# Patient Record
Sex: Male | Born: 1945 | Race: Black or African American | Hispanic: No | Smoking: Former smoker
Health system: Southern US, Community
[De-identification: ages and names within clinical notes are randomized; demographics above are authoritative.]

## PROBLEM LIST (undated history)

## (undated) DIAGNOSIS — E119 Type 2 diabetes mellitus without complications: Secondary | ICD-10-CM

## (undated) DIAGNOSIS — F329 Major depressive disorder, single episode, unspecified: Secondary | ICD-10-CM

## (undated) DIAGNOSIS — K219 Gastro-esophageal reflux disease without esophagitis: Secondary | ICD-10-CM

## (undated) DIAGNOSIS — K449 Diaphragmatic hernia without obstruction or gangrene: Secondary | ICD-10-CM

## (undated) DIAGNOSIS — F4310 Post-traumatic stress disorder, unspecified: Secondary | ICD-10-CM

## (undated) DIAGNOSIS — N401 Enlarged prostate with lower urinary tract symptoms: Secondary | ICD-10-CM

## (undated) DIAGNOSIS — M519 Unspecified thoracic, thoracolumbar and lumbosacral intervertebral disc disorder: Secondary | ICD-10-CM

## (undated) DIAGNOSIS — N529 Male erectile dysfunction, unspecified: Secondary | ICD-10-CM

## (undated) DIAGNOSIS — M5136 Other intervertebral disc degeneration, lumbar region: Secondary | ICD-10-CM

## (undated) DIAGNOSIS — F32A Depression, unspecified: Secondary | ICD-10-CM

## (undated) DIAGNOSIS — I739 Peripheral vascular disease, unspecified: Secondary | ICD-10-CM

## (undated) DIAGNOSIS — I10 Essential (primary) hypertension: Secondary | ICD-10-CM

## (undated) DIAGNOSIS — Z973 Presence of spectacles and contact lenses: Secondary | ICD-10-CM

## (undated) DIAGNOSIS — M48062 Spinal stenosis, lumbar region with neurogenic claudication: Secondary | ICD-10-CM

## (undated) DIAGNOSIS — C61 Malignant neoplasm of prostate: Secondary | ICD-10-CM

## (undated) DIAGNOSIS — N400 Enlarged prostate without lower urinary tract symptoms: Secondary | ICD-10-CM

## (undated) DIAGNOSIS — Z974 Presence of external hearing-aid: Secondary | ICD-10-CM

## (undated) DIAGNOSIS — G8929 Other chronic pain: Secondary | ICD-10-CM

## (undated) DIAGNOSIS — Z860101 Personal history of adenomatous and serrated colon polyps: Secondary | ICD-10-CM

## (undated) DIAGNOSIS — M1990 Unspecified osteoarthritis, unspecified site: Secondary | ICD-10-CM

## (undated) DIAGNOSIS — G5601 Carpal tunnel syndrome, right upper limb: Secondary | ICD-10-CM

## (undated) DIAGNOSIS — Z86010 Personal history of colonic polyps: Secondary | ICD-10-CM

## (undated) DIAGNOSIS — M51369 Other intervertebral disc degeneration, lumbar region without mention of lumbar back pain or lower extremity pain: Secondary | ICD-10-CM

## (undated) DIAGNOSIS — E785 Hyperlipidemia, unspecified: Secondary | ICD-10-CM

## (undated) DIAGNOSIS — N289 Disorder of kidney and ureter, unspecified: Secondary | ICD-10-CM

## (undated) DIAGNOSIS — F411 Generalized anxiety disorder: Secondary | ICD-10-CM

## (undated) DIAGNOSIS — M545 Low back pain, unspecified: Secondary | ICD-10-CM

## (undated) HISTORY — PX: HAND SURGERY: SHX662

## (undated) HISTORY — PX: CARPAL TUNNEL RELEASE: SHX101

## (undated) HISTORY — PX: VASCULAR SURGERY: SHX849

## (undated) HISTORY — PX: ENDOVASCULAR STENT INSERTION: SHX5161

## (undated) HISTORY — DX: Peripheral vascular disease, unspecified: I73.9

## (undated) HISTORY — DX: Hyperlipidemia, unspecified: E78.5

## (undated) HISTORY — PX: UPPER GASTROINTESTINAL ENDOSCOPY: SHX188

## (undated) HISTORY — PX: COLONOSCOPY: SHX174

---

## 1974-03-09 HISTORY — PX: OPEN REDUCTION INTERNAL FIXATION (ORIF) HAND: SHX5991

## 2003-05-09 ENCOUNTER — Emergency Department (HOSPITAL_COMMUNITY): Admission: EM | Admit: 2003-05-09 | Discharge: 2003-05-09 | Payer: Self-pay | Admitting: Emergency Medicine

## 2008-03-09 HISTORY — PX: CARPAL TUNNEL RELEASE: SHX101

## 2009-03-09 DIAGNOSIS — D126 Benign neoplasm of colon, unspecified: Secondary | ICD-10-CM

## 2009-03-09 HISTORY — DX: Benign neoplasm of colon, unspecified: D12.6

## 2012-05-27 ENCOUNTER — Encounter (HOSPITAL_BASED_OUTPATIENT_CLINIC_OR_DEPARTMENT_OTHER): Payer: Self-pay | Admitting: *Deleted

## 2012-05-27 ENCOUNTER — Emergency Department (HOSPITAL_BASED_OUTPATIENT_CLINIC_OR_DEPARTMENT_OTHER): Payer: No Typology Code available for payment source

## 2012-05-27 ENCOUNTER — Emergency Department (HOSPITAL_BASED_OUTPATIENT_CLINIC_OR_DEPARTMENT_OTHER)
Admission: EM | Admit: 2012-05-27 | Discharge: 2012-05-27 | Disposition: A | Discharge status: Home / Self Care | Payer: No Typology Code available for payment source | Attending: Emergency Medicine | Admitting: Emergency Medicine

## 2012-05-27 DIAGNOSIS — M79674 Pain in right toe(s): Secondary | ICD-10-CM

## 2012-05-27 DIAGNOSIS — Z794 Long term (current) use of insulin: Secondary | ICD-10-CM | POA: Insufficient documentation

## 2012-05-27 DIAGNOSIS — S2249XA Multiple fractures of ribs, unspecified side, initial encounter for closed fracture: Secondary | ICD-10-CM | POA: Insufficient documentation

## 2012-05-27 DIAGNOSIS — S8990XA Unspecified injury of unspecified lower leg, initial encounter: Secondary | ICD-10-CM | POA: Insufficient documentation

## 2012-05-27 DIAGNOSIS — E119 Type 2 diabetes mellitus without complications: Secondary | ICD-10-CM | POA: Insufficient documentation

## 2012-05-27 DIAGNOSIS — W010XXA Fall on same level from slipping, tripping and stumbling without subsequent striking against object, initial encounter: Secondary | ICD-10-CM | POA: Insufficient documentation

## 2012-05-27 DIAGNOSIS — Y929 Unspecified place or not applicable: Secondary | ICD-10-CM | POA: Insufficient documentation

## 2012-05-27 DIAGNOSIS — Z79899 Other long term (current) drug therapy: Secondary | ICD-10-CM | POA: Insufficient documentation

## 2012-05-27 DIAGNOSIS — F329 Major depressive disorder, single episode, unspecified: Secondary | ICD-10-CM | POA: Insufficient documentation

## 2012-05-27 DIAGNOSIS — Y9389 Activity, other specified: Secondary | ICD-10-CM | POA: Insufficient documentation

## 2012-05-27 DIAGNOSIS — F3289 Other specified depressive episodes: Secondary | ICD-10-CM | POA: Insufficient documentation

## 2012-05-27 DIAGNOSIS — S99929A Unspecified injury of unspecified foot, initial encounter: Secondary | ICD-10-CM | POA: Insufficient documentation

## 2012-05-27 DIAGNOSIS — S2231XA Fracture of one rib, right side, initial encounter for closed fracture: Secondary | ICD-10-CM

## 2012-05-27 DIAGNOSIS — I10 Essential (primary) hypertension: Secondary | ICD-10-CM | POA: Insufficient documentation

## 2012-05-27 HISTORY — DX: Major depressive disorder, single episode, unspecified: F32.9

## 2012-05-27 HISTORY — DX: Type 2 diabetes mellitus without complications: E11.9

## 2012-05-27 HISTORY — DX: Essential (primary) hypertension: I10

## 2012-05-27 HISTORY — DX: Depression, unspecified: F32.A

## 2012-05-27 MED ORDER — OXYCODONE-ACETAMINOPHEN 5-325 MG PO TABS
1.0000 | ORAL_TABLET | Freq: Once | ORAL | Status: AC
  Administered 2012-05-27: 1 via ORAL
  Filled 2012-05-27 (×2): qty 1

## 2012-05-27 MED ORDER — OXYCODONE-ACETAMINOPHEN 5-325 MG PO TABS: 1.0000 | ORAL_TABLET | Freq: Four times a day (QID) | ORAL | Status: DC | PRN

## 2012-05-27 NOTE — ED Notes (Signed)
Patient was instructed on proper incentive spirometry use. He had used it previously and demonstrated technique well with no complications. Rt will continue to monitor.

## 2012-05-27 NOTE — ED Provider Notes (Signed)
History     CSN: [id number]  Arrival date & time 05/27/12  4098   First MD Initiated Contact with Patient 05/27/12 0957      Chief Complaint  Patient presents with  . rib and toe pain s/p horse falling on him     (Consider location/radiation/quality/duration/timing/severity/associated sxs/prior treatment) HPI Pt presents with pain in right ribs and right 2nd toe x 3 weeks.  Pain began after falling from a horse which tripped and then fell onto him.  Pain is worse with movement and palpation.  He has been taking alleve which has provided mild relief.  No difficulty breathing, no fever or cough.  Denies striking head no neck or back pain.  There are no other associated systemic symptoms, there are no other alleviating or modifying factors.   Past Medical History  Diagnosis Date  . Hypertension   . Diabetes mellitus without complication   . Depression     History reviewed. No pertinent past surgical history.  History reviewed. No pertinent family history.  History  Substance Use Topics  . Smoking status: Never Smoker   . Smokeless tobacco: Not on file  . Alcohol Use: Yes     Comment: occ      Review of Systems ROS reviewed and all otherwise negative except for mentioned in HPI  Allergies  Review of patient's allergies indicates no known allergies.  Home Medications   Current Outpatient Rx  Name  Route  Sig  Dispense  Refill  . clonazePAM (KLONOPIN) 0.5 MG tablet   Oral   Take 0.5 mg by mouth 2 (two) times daily as needed for anxiety.         . insulin aspart (NOVOLOG) 100 UNIT/ML injection   Subcutaneous   Inject into the skin 3 (three) times daily before meals.         Marland Kitchen lisinopril (PRINIVIL,ZESTRIL) 10 MG tablet   Oral   Take 10 mg by mouth daily.         Marland Kitchen oxyCODONE-acetaminophen (PERCOCET/ROXICET) 5-325 MG per tablet   Oral   Take 1-2 tablets by mouth every 6 (six) hours as needed for pain.   15 tablet   0   . ranitidine (ZANTAC) 150 MG  tablet   Oral   Take 150 mg by mouth 2 (two) times daily.         . sertraline (ZOLOFT) 100 MG tablet   Oral   Take 100 mg by mouth daily.           BP 184/70  Pulse 61  Temp(Src) 98.3 F (36.8 C) (Oral)  Resp 20  SpO2 98% Vitals reviewed Physical Exam Physical Examination: General appearance - alert, well appearing, and in no distress Mental status - alert, oriented to person, place, and time Eyes - no conjunctival injection, no scleral icterus Mouth - mucous membranes moist, pharynx normal without lesions Neck - no midline tenderness to palpation, FROM without pain Chest - clear to auscultation, no wheezes, rales or rhonchi, symmetric air entry, ttp over right lower ribs, no crepitus Heart - normal rate, regular rhythm, normal S1, S2, no murmurs, rubs, clicks or gallops Abdomen - soft, nontender, nondistended, no masses or organomegaly Back exam - no midline tenderness to palpation, no CVA tenderness Neurological - alert, oriented, normal speech, strength and sensation intact in extremities x 4 Musculoskeletal - ttp over distal right second toe, otherwise no joint tenderness, deformity or swelling Extremities - peripheral pulses normal, no pedal edema, no clubbing or  cyanosis Skin - normal coloration and turgor, no rashes  ED Course  Procedures (including critical care time)  Labs Reviewed - No data to display Dg Ribs Unilateral W/chest Right  05/27/2012  *RADIOLOGY REPORT*  Clinical Data: Larey Seat from horse 3 weeks ago.  Right-sided rib pain when taking deep breath.  Diabetic hypertensive nonsmoker.  RIGHT RIBS AND CHEST - 3+ VIEW  Comparison: 05/09/2003 chest x-ray.  Findings: Fracture of the anterior lateral aspect of the right ninth and tenth rib.  No pneumothorax.  L1 compression fracture appears remote.  Heart size within normal limits.  Central pulmonary vascular prominence with minimal peribronchial thickening unchanged.  IMPRESSION: Fracture of the anterior lateral  aspect of the right ninth and tenth rib.  No pneumothorax.  L1 compression fracture appears remote.  Central pulmonary vascular prominence with minimal peribronchial thickening unchanged.   Original Report Authenticated By: Lacy Duverney, M.D.    Dg Toe 2nd Right  05/27/2012  *RADIOLOGY REPORT*  Clinical Data: Traumatic injury 3 weeks previous with right second toe pain  RIGTH SECOND TOE  Comparison: None.  Findings: No acute fracture or dislocation is noted.  No soft tissue abnormality is seen.  IMPRESSION: No acute abnormality noted.   Original Report Authenticated By: Alcide Clever, M.D.      1. Rib fracture, right, closed, initial encounter   2. Toe pain, right       MDM  Pt presenting after falling from horse and horse falling on him.  C/o right sided rib pain and right 2nd toe pain.  Injury occurred 3 weeks ago.  Xray shows 9th and 10th rib fracture , no fracture of toe.  Xray images reviewed and interpreted by me as well.  Pt given pain control and incentive spirometer.  Discharged with strict return precautions.  Pt agreeable with plan.        Ethelda Chick, MD 05/27/12 [phone]

## 2012-05-27 NOTE — ED Notes (Signed)
Patient transported to X-ray 

## 2012-05-27 NOTE — ED Notes (Signed)
Right rib pain and second toe on right foot pain states his horse fell on him 3 weeks ago and is continuing to have right sided rib pain especially when trying to take deep breath

## 2014-09-07 HISTORY — PX: BACK SURGERY: SHX140

## 2014-09-25 DIAGNOSIS — N184 Chronic kidney disease, stage 4 (severe): Secondary | ICD-10-CM | POA: Insufficient documentation

## 2014-09-25 DIAGNOSIS — N183 Chronic kidney disease, stage 3 unspecified: Secondary | ICD-10-CM

## 2014-09-25 HISTORY — DX: Chronic kidney disease, stage 3 unspecified: N18.30

## 2014-09-25 HISTORY — PX: POSTERIOR LAMINECTOMY / DECOMPRESSION LUMBAR SPINE: SUR740

## 2015-01-08 DIAGNOSIS — N289 Disorder of kidney and ureter, unspecified: Secondary | ICD-10-CM

## 2015-01-08 HISTORY — DX: Disorder of kidney and ureter, unspecified: N28.9

## 2015-02-25 ENCOUNTER — Emergency Department (HOSPITAL_COMMUNITY): Payer: No Typology Code available for payment source

## 2015-02-25 ENCOUNTER — Emergency Department (HOSPITAL_COMMUNITY)
Admission: EM | Admit: 2015-02-25 | Discharge: 2015-02-26 | Disposition: A | Discharge status: Home / Self Care | Payer: No Typology Code available for payment source | Attending: Emergency Medicine | Admitting: Emergency Medicine

## 2015-02-25 ENCOUNTER — Encounter (HOSPITAL_COMMUNITY): Payer: Self-pay

## 2015-02-25 DIAGNOSIS — E119 Type 2 diabetes mellitus without complications: Secondary | ICD-10-CM | POA: Insufficient documentation

## 2015-02-25 DIAGNOSIS — Z87891 Personal history of nicotine dependence: Secondary | ICD-10-CM | POA: Insufficient documentation

## 2015-02-25 DIAGNOSIS — I129 Hypertensive chronic kidney disease with stage 1 through stage 4 chronic kidney disease, or unspecified chronic kidney disease: Secondary | ICD-10-CM | POA: Insufficient documentation

## 2015-02-25 DIAGNOSIS — Z8669 Personal history of other diseases of the nervous system and sense organs: Secondary | ICD-10-CM | POA: Diagnosis not present

## 2015-02-25 DIAGNOSIS — F329 Major depressive disorder, single episode, unspecified: Secondary | ICD-10-CM | POA: Diagnosis not present

## 2015-02-25 DIAGNOSIS — Z8739 Personal history of other diseases of the musculoskeletal system and connective tissue: Secondary | ICD-10-CM | POA: Insufficient documentation

## 2015-02-25 DIAGNOSIS — Z7984 Long term (current) use of oral hypoglycemic drugs: Secondary | ICD-10-CM | POA: Diagnosis not present

## 2015-02-25 DIAGNOSIS — Z7982 Long term (current) use of aspirin: Secondary | ICD-10-CM | POA: Diagnosis not present

## 2015-02-25 DIAGNOSIS — Z87438 Personal history of other diseases of male genital organs: Secondary | ICD-10-CM | POA: Diagnosis not present

## 2015-02-25 DIAGNOSIS — Z794 Long term (current) use of insulin: Secondary | ICD-10-CM | POA: Diagnosis not present

## 2015-02-25 DIAGNOSIS — Z79899 Other long term (current) drug therapy: Secondary | ICD-10-CM | POA: Diagnosis not present

## 2015-02-25 DIAGNOSIS — R7989 Other specified abnormal findings of blood chemistry: Secondary | ICD-10-CM | POA: Insufficient documentation

## 2015-02-25 DIAGNOSIS — N183 Chronic kidney disease, stage 3 (moderate): Secondary | ICD-10-CM | POA: Insufficient documentation

## 2015-02-25 DIAGNOSIS — R531 Weakness: Secondary | ICD-10-CM | POA: Diagnosis not present

## 2015-02-25 DIAGNOSIS — R51 Headache: Secondary | ICD-10-CM | POA: Insufficient documentation

## 2015-02-25 DIAGNOSIS — R519 Headache, unspecified: Secondary | ICD-10-CM

## 2015-02-25 HISTORY — DX: Unspecified thoracic, thoracolumbar and lumbosacral intervertebral disc disorder: M51.9

## 2015-02-25 HISTORY — DX: Post-traumatic stress disorder, unspecified: F43.10

## 2015-02-25 HISTORY — DX: Disorder of kidney and ureter, unspecified: N28.9

## 2015-02-25 HISTORY — DX: Carpal tunnel syndrome, right upper limb: G56.01

## 2015-02-25 HISTORY — DX: Benign prostatic hyperplasia without lower urinary tract symptoms: N40.0

## 2015-02-25 LAB — ETHANOL

## 2015-02-25 LAB — CBC WITH DIFFERENTIAL/PLATELET
Basophils Absolute: 0 10*3/uL (ref 0.0–0.1)
Basophils Relative: 1 %
EOS ABS: 0.3 10*3/uL (ref 0.0–0.7)
EOS PCT: 4 %
HCT: 35 % — ABNORMAL LOW (ref 39.0–52.0)
HEMOGLOBIN: 11.5 g/dL — AB (ref 13.0–17.0)
LYMPHS ABS: 1.6 10*3/uL (ref 0.7–4.0)
LYMPHS PCT: 24 %
MCH: 28.2 pg (ref 26.0–34.0)
MCHC: 32.9 g/dL (ref 30.0–36.0)
MCV: 85.8 fL (ref 78.0–100.0)
MONOS PCT: 4 %
Monocytes Absolute: 0.3 10*3/uL (ref 0.1–1.0)
NEUTROS PCT: 67 %
Neutro Abs: 4.4 10*3/uL (ref 1.7–7.7)
Platelets: 233 10*3/uL (ref 150–400)
RBC: 4.08 MIL/uL — AB (ref 4.22–5.81)
RDW: 13.5 % (ref 11.5–15.5)
WBC: 6.5 10*3/uL (ref 4.0–10.5)

## 2015-02-25 LAB — BASIC METABOLIC PANEL
Anion gap: 9 (ref 5–15)
BUN: 39 mg/dL — AB (ref 6–20)
CHLORIDE: 103 mmol/L (ref 101–111)
CO2: 23 mmol/L (ref 22–32)
CREATININE: 1.94 mg/dL — AB (ref 0.61–1.24)
Calcium: 9.7 mg/dL (ref 8.9–10.3)
GFR calc Af Amer: 39 mL/min — ABNORMAL LOW (ref >60)
GFR calc non Af Amer: 34 mL/min — ABNORMAL LOW (ref >60)
GLUCOSE: 215 mg/dL — AB (ref 65–99)
POTASSIUM: 4.7 mmol/L (ref 3.5–5.1)
SODIUM: 135 mmol/L (ref 135–145)

## 2015-02-25 LAB — SALICYLATE LEVEL: Salicylate Lvl: <4 mg/dL (ref 2.8–30.0)

## 2015-02-25 LAB — PROTIME-INR
INR: 1.04 (ref 0.00–1.49)
Prothrombin Time: 13.8 seconds (ref 11.6–15.2)

## 2015-02-25 LAB — I-STAT TROPONIN, ED: Troponin i, poc: 0.05 ng/mL (ref 0.00–0.08)

## 2015-02-25 LAB — I-STAT CHEM 8, ED
BUN: 43 mg/dL — AB (ref 6–20)
CHLORIDE: 103 mmol/L (ref 101–111)
CREATININE: 1.9 mg/dL — AB (ref 0.61–1.24)
Calcium, Ion: 1.12 mmol/L — ABNORMAL LOW (ref 1.13–1.30)
GLUCOSE: 205 mg/dL — AB (ref 65–99)
HEMATOCRIT: 38 % — AB (ref 39.0–52.0)
Hemoglobin: 12.9 g/dL — ABNORMAL LOW (ref 13.0–17.0)
POTASSIUM: 4.6 mmol/L (ref 3.5–5.1)
Sodium: 135 mmol/L (ref 135–145)
TCO2: 22 mmol/L (ref 0–100)

## 2015-02-25 LAB — RAPID URINE DRUG SCREEN, HOSP PERFORMED
Amphetamines: NOT DETECTED
BARBITURATES: NOT DETECTED
BENZODIAZEPINES: NOT DETECTED
COCAINE: NOT DETECTED
OPIATES: NOT DETECTED
Tetrahydrocannabinol: POSITIVE — AB

## 2015-02-25 LAB — APTT: APTT: 27 s (ref 24–37)

## 2015-02-25 LAB — ACETAMINOPHEN LEVEL

## 2015-02-25 MED ORDER — SODIUM CHLORIDE 0.9 % IV BOLUS (SEPSIS)
1000.0000 mL | Freq: Once | INTRAVENOUS | Status: AC
  Administered 2015-02-25: 1000 mL via INTRAVENOUS

## 2015-02-25 NOTE — ED Notes (Signed)
Called staffing office for sitter.   

## 2015-02-25 NOTE — ED Notes (Signed)
Called security to wand pt. Notified security that pt is not in scrubs because RN is working pt up for other things, since it is protocol for them to be scrubs.

## 2015-02-25 NOTE — ED Notes (Signed)
Pt walked from inside his house to outside and walked into the ambulance with mo problems.

## 2015-02-25 NOTE — ED Notes (Signed)
Pt returned to room from CT

## 2015-02-25 NOTE — ED Notes (Signed)
Pt denying SI/HI thoughts or ideations at this time.

## 2015-02-25 NOTE — ED Provider Notes (Signed)
CSN: [id number]     Arrival date & time 02/25/15  2102 History   First MD Initiated Contact with Patient 02/25/15 2109     Chief Complaint  Patient presents with  . Headache     (Consider location/radiation/quality/duration/timing/severity/associated sxs/prior Treatment) HPI Comments: Patient is a 69 year old male with a history of hypertension, diabetes mellitus, and depression. He presents to the emergency department for a feeling of generalized weakness with associated headache. He reports that his headache and weakness began at the same time. Symptoms began suddenly. Daughter reports that symptoms began approximately 3 hours ago. Daughter notes that the patient was slumped in his chair. She felt as though he was more weak on his left side. Patient denies unilateral weakness or numbness. He has had no nausea or vomiting. He denies any chest pain or new/worsening shortness of breath. No medications taken prior to arrival for symptoms. Patient states that he has not taken his nightly insulin dose. CBG with EMS was 156. He denies any history of similar headaches. His pain is primarily in his frontal and occipital region. No history of CVA. Patient is not on blood thinners. Patient's primary care provider is at the [facility].  Patient is a 69 y.o. male presenting with headaches. The history is provided by the patient and a relative. No language interpreter was used.  Headache Associated symptoms: weakness (generalized)   Associated symptoms: no fever, no nausea, no numbness and no vomiting     Past Medical History  Diagnosis Date  . Hypertension   . Diabetes mellitus without complication ([place])   . Depression   . Prostate enlargement   . PTSD (post-traumatic stress disorder)   . Lumbar disc disease   . Carpal tunnel syndrome on right   . 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 Back surgery  july 2016  . Hand surgery Left    Hardware insertion    No family history on file. Social History  Substance Use Topics  . Smoking status: Former Research scientist (life sciences)  . Smokeless tobacco: None  . Alcohol Use: Yes     Comment: occ    Review of Systems  Constitutional: Negative for fever.  Respiratory: Negative for shortness of breath.   Cardiovascular: Negative for chest pain.  Gastrointestinal: Negative for nausea and vomiting.  Neurological: Positive for weakness (generalized) and headaches. Negative for syncope and numbness.  All other systems reviewed and are negative.   Allergies  Review of patient's allergies indicates no known allergies.  Home Medications   Prior to Admission medications   Medication Sig Start Date End Date Taking? Authorizing Provider  aspirin 81 MG tablet Take 81 mg by mouth daily.   Yes Historical Provider, MD  glyBURIDE (DIABETA) 5 MG tablet Take 5 mg by mouth daily with breakfast.   Yes Historical Provider, MD  hydrochlorothiazide (HYDRODIURIL) 25 MG tablet Take 25 mg by mouth daily.   Yes Historical Provider, MD  insulin glargine (LANTUS) 100 UNIT/ML injection Inject 30 Units into the skin at bedtime.   Yes Historical Provider, MD  lisinopril (PRINIVIL,ZESTRIL) 20 MG tablet Take 10 mg by mouth daily.   Yes Historical Provider, MD  Omega-3 Fatty Acids (FISH OIL) 1000 MG CAPS Take 2 capsules by mouth 2 (two) times daily.   Yes Historical Provider, MD  ranitidine (ZANTAC) 150 MG tablet Take 150 mg by mouth 2 (two) times daily.   Yes Historical Provider, MD  oxyCODONE-acetaminophen (PERCOCET/ROXICET) 5-325 MG per tablet Take 1-2 tablets by mouth every 6 (six) hours as needed for pain. Patient not taking: Reported on 02/25/2015 05/27/12   Alfonzo Beers, MD   BP 155/78 mmHg  Pulse 63  Temp(Src) 98 F (36.7 C) (Oral)  Resp 12  SpO2 100%   Physical Exam  Constitutional: He is oriented to person, place, and time. He appears well-developed and well-nourished. No distress.  Patient appears fatigued.  He is in NAD.  HENT:  Head: Normocephalic and atraumatic.  Mouth/Throat: No oropharyngeal exudate.  Symmetric rise of the uvula with phonation. Tongue appears midline.  Eyes: Conjunctivae and EOM are normal. Pupils are equal, round, and reactive to light. No scleral icterus.  PERRL. Eyebrow raise symmetric.  Neck: Normal range of motion.  No nuchal rigidity or meningismus  Cardiovascular: Normal rate, regular rhythm and intact distal pulses.   Pulmonary/Chest: Effort normal and breath sounds normal. No respiratory distress. He has no wheezes. He has no rales.  Respirations even and unlabored  Musculoskeletal: Normal range of motion.  Neurological: He is alert and oriented to person, place, and time. No cranial nerve deficit. He exhibits normal muscle tone.  Speech is goal oriented. Patient answers questions appropriately and follows commands. Patient takes some coaxing to complete neuro assessment. He has no appreciable facial droop; symmetric smile. Grips 4/5, appreciated to be secondary to poor effort. Sensation preserved in all extremities, per patient. Strength is symmetric.  Skin: Skin is warm and dry. No rash noted. He is not diaphoretic. No erythema. No pallor.  Psychiatric: His behavior is normal. He exhibits a depressed mood.  Nursing note and vitals reviewed.   ED Course  Procedures (including critical care time) Labs Review Labs Reviewed  CBC WITH DIFFERENTIAL/PLATELET - Abnormal; Notable for the following:    RBC 4.08 (*)    Hemoglobin 11.5 (*)    HCT 35.0 (*)    All other components within normal limits  BASIC METABOLIC PANEL - Abnormal; Notable for the following:    Glucose, Bld 215 (*)    BUN 39 (*)    Creatinine, Ser 1.94 (*)    GFR calc non Af Amer 34 (*)    GFR calc Af Amer 39 (*)    All other components within normal limits  ACETAMINOPHEN LEVEL - Abnormal; Notable for the following:    Acetaminophen (Tylenol), Serum <10 (*)    All other components within  normal limits  URINE RAPID DRUG SCREEN, HOSP PERFORMED - Abnormal; Notable for the following:    Tetrahydrocannabinol POSITIVE (*)    All other components within normal limits  I-STAT CHEM 8, ED - Abnormal; Notable for the following:    BUN 43 (*)    Creatinine, Ser 1.90 (*)    Glucose, Bld 205 (*)    Calcium, Ion 1.12 (*)    Hemoglobin 12.9 (*)    HCT 38.0 (*)    All other components within normal limits  PROTIME-INR  APTT  ETHANOL  SALICYLATE LEVEL  I-STAT TROPOININ, ED    Imaging Review Ct Head Wo Contrast  02/25/2015  CLINICAL DATA:  Headache starting 1 hour ago. EXAM: CT HEAD WITHOUT CONTRAST TECHNIQUE: Contiguous axial images were obtained from the base of the skull through the vertex without intravenous contrast. COMPARISON:  None. FINDINGS: Skull and Sinuses:Negative for fracture or destructive process. The visualized mastoids, middle ears, and imaged paranasal sinuses are clear. Visualized orbits: Negative. Brain: No evidence of acute infarction, hemorrhage, hydrocephalus, or mass lesion/mass  effect. Patchy low-density in the deep cerebral white matter attributed to chronic small vessel disease given hypertension and diabetes risk factors. Most notable injury is in the left lenticulostriate distribution affecting the caudate nucleus and neighboring white matter tracts. IMPRESSION: 1. No acute finding. 2. Moderate chronic small vessel disease. Electronically Signed   By: Monte Fantasia M.D.   On: 02/25/2015 23:06     I have personally reviewed and evaluated these images and lab results as part of my medical decision-making.   EKG Interpretation   Date/Time:  Monday February 25 2015 21:28:11 EST Ventricular Rate:  69 PR Interval:  172 QRS Duration: 94 QT Interval:  396 QTC Calculation: 424 R Axis:   5 Text Interpretation:  Sinus rhythm Left ventricular hypertrophy Confirmed  by LIU MD, Hinton Dyer [id number]) on 02/25/2015 9:34:02 PM      2150 - Communicated to me by nurse that  patient is endorsing SI. He reports that he has PTSD and this is a "hard month" for him. He denies trying to kill himself PTA. He reports that he does not want to live anymore.  2220 - Patient now communicating that he is not currently experiencing SI. He and family report that he does have PTSD and has felt this in the past, but is not currently suicidal. Will continue with work up as ordered. No indication for sitter for SI precautions; will d/c.  2345 - Patient rechecked. I have communicated that he has been shown to have an elevated kidney function today. He states that he does have stage III chronic kidney disease. His creatinine today is, therefore, likely consistent with his baseline. Will refer to his primary care doctor for recheck of this. Patient has no complaints. He states that he is feeling fine. He reports that he is "ready to bust out of here". Given his reassuring laboratory workup and CT imaging, I believe outpatient management is appropriate.  MDM   Final diagnoses:  Acute nonintractable headache, unspecified headache type  Elevated serum creatinine    69 year old male presents to the emergency department for evaluation of headache and generalized weakness. He has no focal deficits noted on exam. Patient is afebrile. He has no nuchal rigidity or meningismus. Laboratory workup and CT head are reassuring. Patient does have an elevated creatinine, but reports a history of stage III chronic kidney disease. Suspect that his kidney function is at baseline and will, therefore, refer to his PCP for follow-up and recheck of this.  Patient appears to be feeling much improved since his arrival. He is now alert and conversive and very pleasant to speak to. I do not believe that there is any indication for further emergent workup. Patient stable for discharge at this time. Return precautions given. Patient and family agreeable to plan with no unaddressed concerns. Patient discharged in  satisfactory condition; VSS.   Filed Vitals:   02/25/15 2200 02/25/15 2215 02/25/15 2230 02/25/15 2315  BP: 152/75 160/86 155/78 166/73  Pulse: 61 65 63 62  Temp:      TempSrc:      Resp: 17 11 12 18   SpO2: 100% 100% 100% 100%      Antonietta Breach, PA-C 02/25/15 Petronila Liu, MD 02/26/15 1212

## 2015-02-25 NOTE — ED Notes (Signed)
Security at bedside wanding pt.

## 2015-02-25 NOTE — ED Notes (Signed)
MD at bedside. 

## 2015-02-25 NOTE — ED Notes (Signed)
Patient transported to CT 

## 2015-02-25 NOTE — Discharge Instructions (Signed)
Your serum creatinine today was 1.92. This is likely close to your normal kidney function given your history of chronic kidney disease. Have your creatinine function rechecked by your primary care doctor. Also follow-up for recheck of your headache symptoms. You may take Tylenol or ibuprofen as needed for persistent headache. Return to the emergency department as needed if symptoms worsen.  General Headache Without Cause A headache is pain or discomfort felt around the head or neck area. The specific cause of a headache may not be found. There are many causes and types of headaches. A few common ones are:  Tension headaches.  Migraine headaches.  Cluster headaches.  Chronic daily headaches. HOME CARE INSTRUCTIONS  Watch your condition for any changes. Take these steps to help with your condition: Managing Pain  Take over-the-counter and prescription medicines only as told by your health care provider.  Lie down in a dark, quiet room when you have a headache.  If directed, apply ice to the head and neck area:  Put ice in a plastic bag.  Place a towel between your skin and the bag.  Leave the ice on for 20 minutes, 2-3 times per day.  Use a heating pad or hot shower to apply heat to the head and neck area as told by your health care provider.  Keep lights dim if bright lights bother you or make your headaches worse. Eating and Drinking  Eat meals on a regular schedule.  Limit alcohol use.  Decrease the amount of caffeine you drink, or stop drinking caffeine. General Instructions  Keep all follow-up visits as told by your health care provider. This is important.  Keep a headache journal to help find out what may trigger your headaches. For example, write down:  What you eat and drink.  How much sleep you get.  Any change to your diet or medicines.  Try massage or other relaxation techniques.  Limit stress.  Sit up straight, and do not tense your muscles.  Do not  use tobacco products, including cigarettes, chewing tobacco, or e-cigarettes. If you need help quitting, ask your health care provider.  Exercise regularly as told by your health care provider.  Sleep on a regular schedule. Get 7-9 hours of sleep, or the amount recommended by your health care provider. SEEK MEDICAL CARE IF:   Your symptoms are not helped by medicine.  You have a headache that is different from the usual headache.  You have nausea or you vomit.  You have a fever. SEEK IMMEDIATE MEDICAL CARE IF:   Your headache becomes severe.  You have repeated vomiting.  You have a stiff neck.  You have a loss of vision.  You have problems with speech.  You have pain in the eye or ear.  You have muscular weakness or loss of muscle control.  You lose your balance or have trouble walking.  You feel faint or pass out.  You have confusion.   This information is not intended to replace advice given to you by your health care provider. Make sure you discuss any questions you have with your health care provider.   Document Released: 02/23/2005 Document Revised: 11/14/2014 Document Reviewed: 06/18/2014 Elsevier Interactive Patient Education 2016 Elsevier [employer]. Chronic Kidney Disease Chronic kidney disease occurs when the kidneys are damaged over a long period. The kidneys are two organs that lie on either side of the spine between the middle of the back and the front of the abdomen. The kidneys:  Remove wastes  and extra water from the blood.  Produce important hormones. These help keep bones strong, regulate blood pressure, and help create red blood cells.  Balance the fluids and chemicals in the blood and tissues. A small amount of kidney damage may not cause problems, but a large amount of damage may make it difficult or impossible for the kidneys to work the way they should. If steps are not taken to slow down the kidney damage or stop it from getting worse, the kidneys may  stop working permanently. Most of the time, chronic kidney disease does not go away. However, it can often be controlled, and those with the disease can usually live normal lives. CAUSES The most common causes of chronic kidney disease are diabetes and high blood pressure (hypertension). Chronic kidney disease may also be caused by:  Diseases that cause the kidneys' filters to become inflamed.  Diseases that affect the immune system.  Genetic diseases.  Medicines that damage the kidneys, such as anti-inflammatory medicines.  Poisoning or exposure to toxic substances.  A reoccurring kidney or urinary infection.  A problem with urine flow. This may be caused by:  Cancer.  Kidney stones.  An enlarged prostate in males. SIGNS AND SYMPTOMS Because the kidney damage in chronic kidney disease occurs slowly, symptoms develop slowly and may not be obvious until the kidney damage becomes severe. A person may have a kidney disease for years without showing any symptoms. Symptoms can include:  Swelling (edema) of the legs, ankles, or feet.  Tiredness (lethargy).  Nausea or vomiting.  Confusion.  Problems with urination, such as:  Decreased urine production.  Frequent urination, especially at night.  Frequent accidents in children who are potty trained.  Muscle twitches and cramps.  Shortness of breath.  Weakness.  Persistent itchiness.  Loss of appetite.  Metallic taste in the mouth.  Trouble sleeping.  Slowed development in children.  Short stature in children. DIAGNOSIS Chronic kidney disease may be detected and diagnosed by tests, including blood, urine, imaging, or kidney biopsy tests. TREATMENT Most chronic kidney diseases cannot be cured. Treatment usually involves relieving symptoms and preventing or slowing the progression of the disease. Treatment may include:  A special diet. You may need to avoid alcohol and foods thatare salty and high in  potassium.  Medicines. These may:  Lower blood pressure.  Relieve anemia.  Relieve swelling.  Protect the bones. HOME CARE INSTRUCTIONS  Follow your prescribed diet. Your health care provider may instruct you to limit daily salt (sodium) and protein intake.  Take medicines only as directed by your health care provider. Do not take any new medicines (prescription, over-the-counter, or nutritional supplements) unless approved by your health care provider. Many medicines can worsen your kidney damage or need to have the dose adjusted.   Quit smoking if you smoke. Talk to your health care provider about a smoking cessation program.  Keep all follow-up visits as directed by your health care provider.  Monitor your blood pressure.  Start or continue an exercise plan.  Get immunizations as directed by your health care provider.  Take vitamin and mineral supplements as directed by your health care provider. SEEK IMMEDIATE MEDICAL CARE IF:  Your symptoms get worse or you develop new symptoms.  You develop symptoms of end-stage kidney disease. These include:  Headaches.  Abnormally dark or light skin.  Numbness in the hands or feet.  Easy bruising.  Frequent hiccups.  Menstruation stops.  You have a fever.  You have decreased  urine production.  You havepain or bleeding when urinating. MAKE SURE YOU:  Understand these instructions.  Will watch your condition.  Will get help right away if you are not doing well or get worse. FOR MORE INFORMATION   American Association of Kidney Patients: BombTimer.gl  National Kidney Foundation: www.kidney.[place]: https://mathis.com/  [employer] [facility]: www.lifeoptions.org and www.kidneyschool.org   This information is not intended to replace advice given to you by your health care provider. Make sure you discuss any questions you have with your health care provider.   Document Released:  12/03/2007 Document Revised: 03/16/2014 Document Reviewed: 10/23/2011 Elsevier Interactive Patient Education [employer].

## 2015-02-25 NOTE — ED Notes (Signed)
Verbal order from [place] PA to D/C sitter.

## 2015-02-25 NOTE — ED Notes (Signed)
Per [facility]: Pt has a headache, started about 1 hour ago. Pt initially not answering questions on scene but would answer questions when in the ambulance away from family. Pt states that he did not take his insulin tonight.

## 2015-03-13 ENCOUNTER — Emergency Department (HOSPITAL_COMMUNITY)
Admission: EM | Admit: 2015-03-13 | Discharge: 2015-03-13 | Disposition: A | Discharge status: Home / Self Care | Payer: No Typology Code available for payment source | Attending: Emergency Medicine | Admitting: Emergency Medicine

## 2015-03-13 ENCOUNTER — Encounter (HOSPITAL_COMMUNITY): Payer: Self-pay | Admitting: Emergency Medicine

## 2015-03-13 ENCOUNTER — Emergency Department (HOSPITAL_COMMUNITY): Payer: No Typology Code available for payment source

## 2015-03-13 DIAGNOSIS — S199XXA Unspecified injury of neck, initial encounter: Secondary | ICD-10-CM | POA: Insufficient documentation

## 2015-03-13 DIAGNOSIS — Z87891 Personal history of nicotine dependence: Secondary | ICD-10-CM | POA: Insufficient documentation

## 2015-03-13 DIAGNOSIS — Z8669 Personal history of other diseases of the nervous system and sense organs: Secondary | ICD-10-CM | POA: Insufficient documentation

## 2015-03-13 DIAGNOSIS — M5440 Lumbago with sciatica, unspecified side: Secondary | ICD-10-CM | POA: Diagnosis not present

## 2015-03-13 DIAGNOSIS — Z87448 Personal history of other diseases of urinary system: Secondary | ICD-10-CM | POA: Insufficient documentation

## 2015-03-13 DIAGNOSIS — Z7982 Long term (current) use of aspirin: Secondary | ICD-10-CM | POA: Insufficient documentation

## 2015-03-13 DIAGNOSIS — Y998 Other external cause status: Secondary | ICD-10-CM | POA: Insufficient documentation

## 2015-03-13 DIAGNOSIS — I10 Essential (primary) hypertension: Secondary | ICD-10-CM | POA: Diagnosis not present

## 2015-03-13 DIAGNOSIS — F329 Major depressive disorder, single episode, unspecified: Secondary | ICD-10-CM | POA: Diagnosis not present

## 2015-03-13 DIAGNOSIS — S0990XA Unspecified injury of head, initial encounter: Secondary | ICD-10-CM | POA: Insufficient documentation

## 2015-03-13 DIAGNOSIS — Z79899 Other long term (current) drug therapy: Secondary | ICD-10-CM | POA: Diagnosis not present

## 2015-03-13 DIAGNOSIS — Y9389 Activity, other specified: Secondary | ICD-10-CM | POA: Diagnosis not present

## 2015-03-13 DIAGNOSIS — Y92410 Unspecified street and highway as the place of occurrence of the external cause: Secondary | ICD-10-CM | POA: Diagnosis not present

## 2015-03-13 DIAGNOSIS — M545 Low back pain: Secondary | ICD-10-CM

## 2015-03-13 DIAGNOSIS — E119 Type 2 diabetes mellitus without complications: Secondary | ICD-10-CM | POA: Insufficient documentation

## 2015-03-13 DIAGNOSIS — M542 Cervicalgia: Secondary | ICD-10-CM

## 2015-03-13 MED ORDER — ACETAMINOPHEN 325 MG PO TABS
650.0000 mg | ORAL_TABLET | Freq: Once | ORAL | Status: AC
  Administered 2015-03-13: 650 mg via ORAL
  Filled 2015-03-13: qty 2

## 2015-03-13 MED ORDER — TRAMADOL HCL 50 MG PO TABS: 50.0000 mg | ORAL_TABLET | Freq: Four times a day (QID) | ORAL | Status: DC | PRN

## 2015-03-13 MED ORDER — ACETAMINOPHEN 325 MG PO TABS: 650.0000 mg | ORAL_TABLET | Freq: Four times a day (QID) | ORAL | Status: DC | PRN
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
## 2016-11-02 HISTORY — PX: POSTERIOR LUMBAR FUSION: SHX6036

## 2018-02-10 DIAGNOSIS — M25531 Pain in right wrist: Secondary | ICD-10-CM | POA: Insufficient documentation

## 2018-04-27 DIAGNOSIS — Z981 Arthrodesis status: Secondary | ICD-10-CM | POA: Insufficient documentation

## 2018-09-27 DIAGNOSIS — R569 Unspecified convulsions: Secondary | ICD-10-CM

## 2018-09-27 HISTORY — DX: Unspecified convulsions: R56.9

## 2019-03-10 DIAGNOSIS — I739 Peripheral vascular disease, unspecified: Secondary | ICD-10-CM

## 2019-03-10 HISTORY — DX: Peripheral vascular disease, unspecified: I73.9

## 2020-05-08 ENCOUNTER — Encounter: Payer: Self-pay | Admitting: Physician Assistant

## 2020-05-21 DIAGNOSIS — K219 Gastro-esophageal reflux disease without esophagitis: Secondary | ICD-10-CM | POA: Insufficient documentation

## 2020-05-21 DIAGNOSIS — F4310 Post-traumatic stress disorder, unspecified: Secondary | ICD-10-CM | POA: Insufficient documentation

## 2020-05-21 DIAGNOSIS — D631 Anemia in chronic kidney disease: Secondary | ICD-10-CM

## 2020-05-21 HISTORY — DX: Gastro-esophageal reflux disease without esophagitis: K21.9

## 2020-05-23 ENCOUNTER — Encounter: Payer: Self-pay | Admitting: Physician Assistant

## 2020-05-23 ENCOUNTER — Ambulatory Visit (INDEPENDENT_AMBULATORY_CARE_PROVIDER_SITE_OTHER): Payer: No Typology Code available for payment source | Admitting: Physician Assistant

## 2020-05-23 ENCOUNTER — Other Ambulatory Visit (INDEPENDENT_AMBULATORY_CARE_PROVIDER_SITE_OTHER): Payer: No Typology Code available for payment source

## 2020-05-23 VITALS — BP 126/56 | HR 78 | Ht 66.0 in | Wt 145.2 lb

## 2020-05-23 DIAGNOSIS — Z8719 Personal history of other diseases of the digestive system: Secondary | ICD-10-CM

## 2020-05-23 DIAGNOSIS — R1013 Epigastric pain: Secondary | ICD-10-CM

## 2020-05-23 DIAGNOSIS — R935 Abnormal findings on diagnostic imaging of other abdominal regions, including retroperitoneum: Secondary | ICD-10-CM | POA: Diagnosis not present

## 2020-05-23 LAB — CBC WITH DIFFERENTIAL/PLATELET
Basophils Absolute: 0.1 10*3/uL (ref 0.0–0.1)
Basophils Relative: 0.6 % (ref 0.0–3.0)
Eosinophils Absolute: 0.2 10*3/uL (ref 0.0–0.7)
Eosinophils Relative: 1.9 % (ref 0.0–5.0)
HCT: 33.6 % — ABNORMAL LOW (ref 39.0–52.0)
Hemoglobin: 11.1 g/dL — ABNORMAL LOW (ref 13.0–17.0)
Lymphocytes Relative: 7.9 % — ABNORMAL LOW (ref 12.0–46.0)
Lymphs Abs: 0.8 10*3/uL (ref 0.7–4.0)
MCHC: 33.1 g/dL (ref 30.0–36.0)
MCV: 86.3 fl (ref 78.0–100.0)
Monocytes Absolute: 0.7 10*3/uL (ref 0.1–1.0)
Monocytes Relative: 6.8 % (ref 3.0–12.0)
Neutro Abs: 8 10*3/uL — ABNORMAL HIGH (ref 1.4–7.7)
Neutrophils Relative %: 82.8 % — ABNORMAL HIGH (ref 43.0–77.0)
Platelets: 279 10*3/uL (ref 150.0–400.0)
RBC: 3.89 Mil/uL — ABNORMAL LOW (ref 4.22–5.81)
RDW: 14.6 % (ref 11.5–15.5)
WBC: 9.6 10*3/uL (ref 4.0–10.5)

## 2020-05-23 LAB — COMPREHENSIVE METABOLIC PANEL
ALT: 6 U/L (ref 0–53)
AST: 13 U/L (ref 0–37)
Albumin: 4.1 g/dL (ref 3.5–5.2)
Alkaline Phosphatase: 102 U/L (ref 39–117)
BUN: 33 mg/dL — ABNORMAL HIGH (ref 6–23)
CO2: 24 mEq/L (ref 19–32)
Calcium: 9.2 mg/dL (ref 8.4–10.5)
Chloride: 102 mEq/L (ref 96–112)
Creatinine, Ser: 1.67 mg/dL — ABNORMAL HIGH (ref 0.40–1.50)
GFR: 40.45 mL/min — ABNORMAL LOW (ref >60.00)
Glucose, Bld: 230 mg/dL — ABNORMAL HIGH (ref 70–99)
Potassium: 4.7 mEq/L (ref 3.5–5.1)
Sodium: 134 mEq/L — ABNORMAL LOW (ref 135–145)
Total Bilirubin: 0.5 mg/dL (ref 0.2–1.2)
Total Protein: 7.2 g/dL (ref 6.0–8.3)

## 2020-05-23 LAB — LIPASE: Lipase: 46 U/L (ref 11.0–59.0)

## 2020-05-23 NOTE — Patient Instructions (Signed)
If you are age 73 or older, your body mass index should be between 23-30. Your Body mass index is 23.44 kg/m. If this is out of the aforementioned range listed, please consider follow up with your Primary Care Provider.  If you are age 63 or younger, your body mass index should be between 19-25. Your Body mass index is 23.44 kg/m. If this is out of the aformentioned range listed, please consider follow up with your Primary Care Provider.   Your provider has requested that you go to the basement level for lab work before leaving today. Press "B" on the elevator. The lab is located at the first door on the left as you exit the elevator.  You have been scheduled for an endoscopy. Please follow written instructions given to you at your visit today. If you use inhalers (even only as needed), please bring them with you on the day of your procedure.  You will be contacted by [facility] in the next 2 days to arrange a MRI Abdomen/Pelvis.  The number on your caller ID will be [phone], please answer when they call.  If you have not heard from them in 2 days please call [phone] to schedule.    Continue Pantoprazole 40 mg twice daily.  Follow a low fat diet.  Follow up pending at this time.   Thank you for entrusting me with your care and choosing [facility].  Amy Esterwood, PA-C

## 2020-05-23 NOTE — Progress Notes (Signed)
Subjective:    Patient ID: George Harrell, male    DOB: 04-17-1945, 75 y.o.   MRN: [id number]  HPI George Harrell is a pleasant 75 year old African-American male, new to GI today referred by the [facility] for further evaluation of abdominal pain.  Patient relates that he has been having issues with epigastric pain over the past 2 years which has gradually worsened over the past months.  He says his pain is generally worse postprandially and describes it as a dull discomfort that sometimes makes him feel weak.  He has not been having any nausea or vomiting.  He has not had any consistent weight loss and said his weight has been up and down.  He does have issues with constipation and takes an over-the-counter stool softener.  He usually has a bowel movement 2 or 3 times per week he has not noted any melena or hematochezia.  He says he has tried MiraLAX in the past which was not helpful. He is currently on Protonix 40 mg p.o. twice daily for GERD symptoms and says it is effective for that.  He has no complaints of dysphagia or odynophagia.  Appetite usually only fair. No regular NSAIDs, no regular EtOH. He has history of PTSD, history of seizures, chronic kidney disease stage IV, anemia of chronic disease, adult onset diabetes mellitus, chronic anxiety and peripheral arterial disease status post revascularization of the left lower extremity within the past year.  He is also had prior laminectomy. Records which accompany him relate that he had an EGD in 2019 showing erosive gastritis.  I am not sure where that was done. Records also indicate that he is had at least 3 prior colonoscopies with history of tubular adenomatous polyps.  It appears he had 8 polyps removed in 2011, repeat exam in 2014 with a tubular adenoma of the sigmoid colon and repeat exam in 2019 with a hyperplastic polyp and is indicated for follow-up in 2024.  This is per the MD notes, I do not have copies of those reports.  Patient says he  had the colonoscopies done at the [facility] in [place]. Patient had a CT of the abdomen and pelvis done without IV contrast on 05/02/2020 through the [facility] which showed probable mild stranding at the head of the pancreas, no focal fluid collection evaluation of for pancreatic enhancement unable to be done due to lack of IV contrast question early pancreatitis, liver and spleen and gallbladder appeared unremarkable, small hiatal hernia no adenopathy there are postsurgical changes of the lumbar spine. Review of Systems Pertinent positive and negative review of systems were noted in the above HPI section.  All other review of systems was otherwise negative.  Outpatient Encounter Medications as of 05/23/2020  Medication Sig  . amLODipine (NORVASC) 5 MG tablet Take 5 mg by mouth daily.  Marland Kitchen aspirin 81 MG tablet Take 81 mg by mouth daily.  Marland Kitchen buPROPion (WELLBUTRIN XL) 300 MG 24 hr tablet Take 1 tablet by mouth daily.  . carbamazepine (TEGRETOL) 200 MG tablet Take 1 tablet by mouth 4 (four) times daily.  . Cholecalciferol 25 MCG (1000 UT) capsule Take 1 capsule by mouth daily.  Mariane Baumgarten Calcium (STOOL SOFTENER PO) Take 1 tablet by mouth daily at 2 PM.  . ferrous sulfate 325 (65 FE) MG tablet Take 1 tablet by mouth 3 (three) times a week.  . hydrochlorothiazide (HYDRODIURIL) 25 MG tablet Take 0.5 tablets by mouth daily.  . hydrOXYzine (ATARAX/VISTARIL) 10 MG tablet Take 1 tablet  by mouth as needed.  . insulin glargine (LANTUS) 100 UNIT/ML injection Inject 32 Units into the skin at bedtime.  Marland Kitchen lisinopril (ZESTRIL) 40 MG tablet Take 1 tablet by mouth daily.  . Omega-3 Fatty Acids (FISH OIL) 1000 MG CAPS Take 2 capsules by mouth 2 (two) times daily.  . ondansetron (ZOFRAN) 8 MG tablet Take 8 mg by mouth 3 (three) times daily as needed for nausea or vomiting.  Marland Kitchen oxybutynin (DITROPAN-XL) 5 MG 24 hr tablet Take 5 mg by mouth at bedtime.  . pantoprazole (PROTONIX) 40 MG tablet TAKE ONE TABLET BY MOUTH TWICE A DAY  BEFORE MEALS (TAKE ON AN EMPTY STOMACH 30 MINUTES PRIOR TO A MEAL) FOR GERD  . pravastatin (PRAVACHOL) 10 MG tablet Take 10 mg by mouth daily.  . tamsulosin (FLOMAX) 0.4 MG CAPS capsule Take 0.4 mg by mouth 2 (two) times daily.  Marland Kitchen venlafaxine XR (EFFEXOR-XR) 75 MG 24 hr capsule Take 3 capsules by mouth daily.  . vitamin B-12 (CYANOCOBALAMIN) 500 MCG tablet Take 1 tablet by mouth daily.  . [DISCONTINUED] Alogliptin Benzoate 25 MG TABS Take 0.5 tablets by mouth daily. (Patient not taking: Reported on 05/23/2020)  . [DISCONTINUED] ranitidine (ZANTAC) 150 MG tablet Take 150 mg by mouth 2 (two) times daily. (Patient not taking: Reported on 05/23/2020)   No facility-administered encounter medications on file as of 05/23/2020.   Allergies  Allergen Reactions  . Simvastatin     Other reaction(s): Other (See Comments) Legs cramp and I can't walk.   Patient Active Problem List   Diagnosis Date Noted  . GERD (gastroesophageal reflux disease) 05/21/2020  . PTSD (post-traumatic stress disorder) 05/21/2020  . Anemia of chronic renal failure 05/21/2020  . Seizures ([facility]) 09/27/2018  . Chronic renal failure, stage 4 (severe) ([facility]) 09/25/2014  . Adenomatous polyp of colon 03/09/2009   Social History   Socioeconomic History  . Marital status: Legally Separated    Spouse name: Not on file  . Number of children: Not on file  . Years of education: Not on file  . Highest education level: Not on file  Occupational History  . Not on file  Tobacco Use  . Smoking status: Former Smoker    Types: Cigarettes  . Smokeless tobacco: Never Used  Vaping Use  . Vaping Use: Never used  Substance and Sexual Activity  . Alcohol use: Not Currently  . Drug use: Yes    Types: Marijuana  . Sexual activity: Not on file  Other Topics Concern  . Not on file  Social History Narrative  . Not on file   Social Determinants of Health   Financial Resource Strain: Not on file  Food Insecurity: Not on file   Transportation Needs: Not on file  Physical Activity: Not on file  Stress: Not on file  Social Connections: Not on file  Intimate Partner Violence: Not on file    Mr. Wishart's family history includes Colon cancer in his brother; Colon polyps in his brother; Diabetes in his sister; Heart disease in his brother; Stroke in his mother.      Objective:    Vitals:   05/23/20 1133  BP: (!) 126/56  Pulse: 78    Physical Exam Well-developed , thin older African-American male in no acute distress.  Height, Weight,145  BMI 23.4  HEENT; nontraumatic normocephalic, EOMI, PE R LA, sclera anicteric. Oropharynx; not examined today Neck; supple, no JVD Cardiovascular; regular rate and rhythm with S1-S2, no murmur rub or gallop Pulmonary; Clear bilaterally  Abdomen; soft, he is tender in the hypogastrium, and mid abdomen, nondistended, no palpable mass or hepatosplenomegaly, bowel sounds are active Rectal; not done today Skin; benign exam, no jaundice rash or appreciable lesions Extremities; no clubbing cyanosis or edema skin warm and dry Neuro/Psych; alert and oriented x4, grossly nonfocal mood and affect appropriate       Assessment & Plan:   #24 75 year old African-American male with chronic complaints of epigastric pain over the past 2 years progressive over the past several months.  Patient has been experiencing increased pain postprandially, no nausea or vomiting and no significant weight loss. Etiology of his pain is not entirely clear.  Reports indicate prior EGD in 2019 with erosive gastropathy, rule out chronic gastropathy, peptic ulcer disease Recent CT without IV contrast showed probable early pancreatitis, unable to evaluate vasculature  Rule out acute on chronic pancreatitis, rule out pancreatic neoplasm, rule out possible mesenteric insufficiency  #2 chronic kidney disease stage IV 3.  Anemia of chronic disease 4.  Adult onset diabetes mellitus 5.  Chronic anxiety and  history of PTSD 6.  Status post prior laminectomy 7.  Chronic GERD 8.  Peripheral arterial disease status post revascularization of the left lower extremity 9.  History of adenomatous colon polyps, by reports up-to-date with colonoscopy last done in 2019 with finding of a hyperplastic polyp and due for follow-up in 2024  Plan; CBC with differential, c-Met, lipase Continue Protonix 40 mg p.o. twice daily Patient will be scheduled for MRI of the abdomen attention pancreas We will also go ahead and schedule for EGD with Dr. Silverio Decamp.  Procedure was discussed in detail with patient including indications risks and benefits and he is agreeable to proceed. Further recommendations pending results of above.   Tiernan Millikin S Afton Lavalle PA-C 05/23/2020   Cc: No ref. provider found

## 2020-06-05 ENCOUNTER — Telehealth: Payer: Self-pay | Admitting: Physician Assistant

## 2020-06-05 NOTE — Telephone Encounter (Signed)
Peggy from Centralized Scheduling is requesting a call back to [place] in the MRI dept to clarify orders on the pt's MRI.  Bonnie GA [phone]

## 2020-06-05 NOTE — Telephone Encounter (Signed)
Left message for George Harrell to Call back

## 2020-06-06 ENCOUNTER — Ambulatory Visit (HOSPITAL_COMMUNITY)
Admission: RE | Admit: 2020-06-06 | Discharge: 2020-06-06 | Disposition: A | Discharge status: Home / Self Care | Payer: No Typology Code available for payment source | Source: Ambulatory Visit | Attending: Physician Assistant | Admitting: Physician Assistant

## 2020-06-06 ENCOUNTER — Other Ambulatory Visit: Payer: Self-pay

## 2020-06-06 DIAGNOSIS — R935 Abnormal findings on diagnostic imaging of other abdominal regions, including retroperitoneum: Secondary | ICD-10-CM | POA: Insufficient documentation

## 2020-06-06 DIAGNOSIS — R1013 Epigastric pain: Secondary | ICD-10-CM | POA: Insufficient documentation

## 2020-06-06 DIAGNOSIS — Z8719 Personal history of other diseases of the digestive system: Secondary | ICD-10-CM | POA: Diagnosis present

## 2020-06-06 MED ORDER — GADOBUTROL 1 MMOL/ML IV SOLN
7.0000 mL | Freq: Once | INTRAVENOUS | Status: AC | PRN
  Administered 2020-06-06: 7 mL via INTRAVENOUS

## 2020-06-06 NOTE — Telephone Encounter (Signed)
George Harrell returned phone call and discussed reason for pelvic CT.

## 2020-06-10 ENCOUNTER — Other Ambulatory Visit: Payer: Self-pay

## 2020-06-10 ENCOUNTER — Ambulatory Visit (AMBULATORY_SURGERY_CENTER): Payer: No Typology Code available for payment source | Admitting: Gastroenterology

## 2020-06-10 ENCOUNTER — Encounter: Payer: Self-pay | Admitting: Gastroenterology

## 2020-06-10 VITALS — BP 123/67 | HR 67 | Temp 97.8°F | Resp 22

## 2020-06-10 DIAGNOSIS — R1013 Epigastric pain: Secondary | ICD-10-CM | POA: Diagnosis not present

## 2020-06-10 HISTORY — PX: ESOPHAGOGASTRODUODENOSCOPY: SHX1529

## 2020-06-10 MED ORDER — SODIUM CHLORIDE 0.9 % IV SOLN: 500.0000 mL | Freq: Once | INTRAVENOUS | Status: DC

## 2020-06-10 NOTE — Progress Notes (Signed)
A/ox3, pleased with MAC, report to RN 

## 2020-06-10 NOTE — Op Note (Signed)
[facility] Patient Name: George Harrell Procedure Date: 06/10/2020 11:23 AM MRN: [id number] Endoscopist: Mauri Pole , MD Age: 75 Referring MD:  Date of Birth: 1945/10/11 Gender: Male Account #: [id number] Procedure:                Upper GI endoscopy Indications:              Epigastric abdominal pain Medicines:                Monitored Anesthesia Care Procedure:                Pre-Anesthesia Assessment:                           - Prior to the procedure, a History and Physical                            was performed, and patient medications and                            allergies were reviewed. The patient's tolerance of                            previous anesthesia was also reviewed. The risks                            and benefits of the procedure and the sedation                            options and risks were discussed with the patient.                            All questions were answered, and informed consent                            was obtained. ASA Grade Assessment: III - A patient                            with severe systemic disease. After reviewing the                            risks and benefits, the patient was deemed in                            satisfactory condition to undergo the procedure.                           After obtaining informed consent, the endoscope was                            passed under direct vision. Throughout the                            procedure, the patient's blood pressure, pulse, and  oxygen saturations were monitored continuously. The                            Endoscope was introduced through the mouth, and                            advanced to the second part of duodenum. The upper                            GI endoscopy was accomplished without difficulty.                            The patient tolerated the procedure well. Scope In: Scope Out: Findings:                 The  Z-line was regular and was found 38 cm from the                            incisors.                           The gastroesophageal flap valve was visualized                            endoscopically and classified as Hill Grade III                            (minimal fold, loose to endoscope, hiatal hernia                            likely).                           A 2 cm hiatal hernia was present.                           Patchy mild inflammation characterized by                            congestion (edema) and erythema was found in the                            gastric antrum and body.                           The cardia and gastric fundus were normal on                            retroflexion.                           The examined duodenum was normal. Complications:            No immediate complications. Estimated Blood Loss:     Estimated blood loss was minimal. Impression:               - Z-line regular, 38  cm from the incisors.                           - Gastroesophageal flap valve classified as Hill                            Grade III (minimal fold, loose to endoscope, hiatal                            hernia likely).                           - 2 cm hiatal hernia.                           - Gastritis.                           - Normal examined duodenum.                           - No specimens collected. Recommendation:           - Patient has a contact number available for                            emergencies. The signs and symptoms of potential                            delayed complications were discussed with the                            patient. Return to normal activities tomorrow.                            Written discharge instructions were provided to the                            patient.                           - Resume previous diet.                           - Continue present medications.                           - Follow an antireflux  regimen. Mauri Pole, MD 06/10/2020 11:42:11 AM This report has been signed electronically.

## 2020-06-10 NOTE — Progress Notes (Signed)
Patient cbg on arrival to recovery is 59.  Patient states he "feels fine".  Provided cranberry juice to drink.

## 2020-06-10 NOTE — Progress Notes (Signed)
C.W. vital signs. 

## 2020-06-10 NOTE — Patient Instructions (Addendum)
HANDOUTS PROVIDED ON: HIATAL HERNIA & GASTRITIS  You may resume your previous diet and medication schedule.  Thank you for allowing [place] to care for you today!!!   YOU HAD AN ENDOSCOPIC PROCEDURE TODAY AT [facility]:   Refer to the procedure report that was given to you for any specific questions about what was found during the examination.  If the procedure report does not answer your questions, please call your gastroenterologist to clarify.  If you requested that your care partner not be given the details of your procedure findings, then the procedure report has been included in a sealed envelope for you to review at your convenience later.  YOU SHOULD EXPECT: Some feelings of bloating in the abdomen. Passage of more gas than usual.  Walking can help get rid of the air that was put into your GI tract during the procedure and reduce the bloating.   Please Note:  You might notice some irritation and congestion in your nose or some drainage.  This is from the oxygen used during your procedure.  There is no need for concern and it should clear up in a day or so.  SYMPTOMS TO REPORT IMMEDIATELY:   Following upper endoscopy (EGD)  Vomiting of blood or coffee ground material  New chest pain or pain under the shoulder blades  Painful or persistently difficult swallowing  New shortness of breath  Fever of 100F or higher  Black, tarry-looking stools  For urgent or emergent issues, a gastroenterologist can be reached at any hour by calling [phone]. Do not use MyChart messaging for urgent concerns.    DIET:  We do recommend a small meal at first, but then you may proceed to your regular diet.  Drink plenty of fluids but you should avoid alcoholic beverages for 24 hours.  ACTIVITY:  You should plan to take it easy for the rest of today and you should NOT DRIVE or use heavy machinery until tomorrow (because of the sedation medicines used during the test).    FOLLOW  UP: Our staff will call the number listed on your records 48-72 hours following your procedure to check on you and address any questions or concerns that you may have regarding the information given to you following your procedure. If we do not reach you, we will leave a message.  We will attempt to reach you two times.  During this call, we will ask if you have developed any symptoms of COVID 19. If you develop any symptoms (ie: fever, flu-like symptoms, shortness of breath, cough etc.) before then, please call [phone].  If you test positive for Covid 19 in the 2 weeks post procedure, please call and report this information to [place].    If any biopsies were taken you will be contacted by phone or by letter within the next 1-3 weeks.  Please call us at [phone] if you have not heard about the biopsies in 3 weeks.    SIGNATURES/CONFIDENTIALITY: You and/or your care partner have signed paperwork which will be entered into your electronic medical record.  These signatures attest to the fact that that the information above on your After Visit Summary has been reviewed and is understood.  Full responsibility of the confidentiality of this discharge information lies with you and/or your care-partner.

## 2020-06-10 NOTE — Progress Notes (Signed)
Pt's states no medical or surgical changes since previsit or office visit. 

## 2020-06-12 ENCOUNTER — Telehealth: Payer: Self-pay

## 2020-06-12 NOTE — Telephone Encounter (Signed)
LVM

## 2020-06-12 NOTE — Telephone Encounter (Signed)
Left message on follow up call. 

## 2020-06-29 NOTE — Progress Notes (Signed)
Reviewed and agree with documentation and assessment and plan. K. Veena Kemond Amorin , MD   

## 2020-07-31 HISTORY — PX: POSTERIOR LUMBAR FUSION: SHX6036

## 2021-03-17 NOTE — Progress Notes (Signed)
GU Location of Tumor / Histology: Prostate Ca  If Prostate Cancer, Gleason Score is (3 + 3) and PSA is (8.58 as of 11/21/2020)  Biopsies:      Past/Anticipated interventions by urology, if any:   Weight changes, if any: Loss 10 lb over one year.  IPSS:  23 SHIM:  14  Bowel/Bladder complaints, if any:  Bowel sometimes constipation and urinary frequency.  Nausea/Vomiting, if any: Nausea no vomiting.  Pain issues, if any:  0/10  SAFETY ISSUES: Prior radiation?  No Pacemaker/ICD?  No Possible current pregnancy? Male Is the patient on methotrexate? No  Current Complaints / other details:  Need more information on treatment options.

## 2021-03-27 DIAGNOSIS — C61 Malignant neoplasm of prostate: Secondary | ICD-10-CM | POA: Insufficient documentation

## 2021-03-27 NOTE — Progress Notes (Signed)
Radiation Oncology         [phone] ________________________________  Initial Outpatient Consultation  Name: George Harrell MRN: [id number]  Date: 03/28/2021  DOB: 1946/03/09  JE:HUDJSH, Provider Not In  Alphia Kava, MD   REFERRING PHYSICIAN: Alphia Kava, MD  DIAGNOSIS: 76 y.o. gentleman with Stage T1c adenocarcinoma of the prostate with Gleason score of 3+4, and PSA of 8.58.    ICD-10-CM   1. Malignant neoplasm of prostate ([place])  C61       HISTORY OF PRESENT ILLNESS: George Harrell is a 76 y.o. male with a diagnosis of prostate cancer. He was noted to have an elevated PSA of 8.58 on 11/21/20.  Accordingly, he was referred for evaluation in urology by Dr. Domenica Fail.  The patient proceeded to transrectal ultrasound with 12 biopsies of the prostate on 02/18/21.  The prostate volume measured 40 cc.  Out of 12 core biopsies, 4 out of 7 fragments were positive on the right and left.  The maximum Gleason score was 3+4 seen in 50% of the left-sided specimens with Gleason3+3 in 10-15% of the right-sided specimens.  The patient reviewed the biopsy results with his urologist and he has kindly been referred today for discussion of potential radiation treatment options.   PREVIOUS RADIATION THERAPY: No  PAST MEDICAL HISTORY:  Past Medical History:  Diagnosis Date   Acid reflux 05/21/2020   Last Assessment & Plan:  Formatting of this note might be different from the original. Continue home protonix   Adenomatous polyp of colon 03/09/2009   Formatting of this note might be different from the original. Oct 05, 2017 Entered By: Christene Lye Comment: 8.2011 TAx8/trv/Chris Connelly; 7.2014 TA/sig; 7.2019 only HP *Surveillance 7.2024   Carpal tunnel syndrome on right    Chronic renal failure, stage 3 (moderate) ([place]) 09/25/2014   Last Assessment & Plan:  Formatting of this note might be different from the original. Cr (08/2018) 1.99, patient reports baseline ~[facility].00 [facility]) Cr on  admission 1.88 > 1.43 at discharge  Patient received IVF hydration pre/post IR procedure  Avoid nephrotoxic meds (morphine, NSAIDs, contrast dye, etc.) Renally dose medications for CrCl Continue to follow outpatient with [facility] provider for mon   Depression    Diabetes mellitus without complication ([place])    Hyperlipidemia    Hypertension    Hypertension    Lumbar disc disease    Prostate enlargement    PTSD (post-traumatic stress disorder)    PVD (peripheral vascular disease) ([place])    Renal disorder november 2016   Stage 3    Seizures ([place]) 09/27/2018      PAST SURGICAL HISTORY: Past Surgical History:  Procedure Laterality Date   BACK SURGERY  july 2016   CARPAL TUNNEL RELEASE     COLONOSCOPY     HAND SURGERY Left    Hardware insertion    UPPER GASTROINTESTINAL ENDOSCOPY     VASCULAR SURGERY Left     FAMILY HISTORY:  Family History  Problem Relation Age of Onset   Stroke Mother    Diabetes Sister    Colon cancer Brother    Colon polyps Brother    Heart disease Brother    Esophageal cancer Neg Hx    Pancreatic cancer Neg Hx    Stomach cancer Neg Hx     SOCIAL HISTORY:  Social History   Socioeconomic History   Marital status: Legally Separated    Spouse name: Not on file   Number of children: Not on file  Years of education: Not on file   Highest education level: Not on file  Occupational History   Not on file  Tobacco Use   Smoking status: Former    Types: Cigarettes   Smokeless tobacco: Never  Vaping Use   Vaping Use: Never used  Substance and Sexual Activity   Alcohol use: Not Currently   Drug use: Yes    Types: Marijuana   Sexual activity: Not on file  Other Topics Concern   Not on file  Social History Narrative   Not on file   Social Determinants of Health   Financial Resource Strain: Not on file  Food Insecurity: Not on file  Transportation Needs: Not on file  Physical Activity: Not on file  Stress: Not on file  Social Connections: Not on file   Intimate Partner Violence: Not on file    ALLERGIES: Simvastatin  MEDICATIONS:  Current Outpatient Medications  Medication Sig Dispense Refill   aspirin 81 MG EC tablet TAKE ONE TABLET BY MOUTH DAILY FOR HEART     buPROPion (WELLBUTRIN XL) 300 MG 24 hr tablet TAKE ONE TABLET BY MOUTH IN THE MORNING FOR DEPRESSION     gabapentin (NEURONTIN) 100 MG capsule TAKE ONE CAPSULE BY MOUTH AT BEDTIME -- START WITH 1 CAPSULE AT BEDTIME. MAY TAKE UP TO 3 CAPSULES AT BEDTIME FOR NIGHTMARES AND INSOMNIA     hydrOXYzine (ATARAX) 10 MG tablet TAKE ONE TABLET BY MOUTH DAILY AS NEEDED FOR ANXIETY (MAY CAUSE DROWSINESS)     insulin glargine-yfgn (SEMGLEE) 100 UNIT/ML injection INJECT 24 UNITS SUBCUTANEOUSLY AT BEDTIME FOR DIABETES (USE WITHIN 28 DAYS AFTER OPENING VIAL) (CONVERTED FROM LANTUS)     methocarbamol (ROBAXIN) 500 MG tablet Take 1 tablet by mouth 3 (three) times daily as needed.     pantoprazole (PROTONIX) 40 MG tablet TAKE ONE TABLET BY MOUTH TWICE A DAY BEFORE MEALS (TAKE ON AN EMPTY STOMACH 30 MINUTES PRIOR TO A MEAL) FOR GERD     vitamin B-12 (CYANOCOBALAMIN) 500 MCG tablet Take by mouth.     amLODipine (NORVASC) 5 MG tablet Take 5 mg by mouth daily.     aspirin 81 MG tablet Take 81 mg by mouth daily.     buPROPion (WELLBUTRIN XL) 300 MG 24 hr tablet Take 1 tablet by mouth daily.     carbamazepine (TEGRETOL) 200 MG tablet Take 1 tablet by mouth 4 (four) times daily.     Cholecalciferol 25 MCG (1000 UT) capsule Take 1 capsule by mouth daily.     Docusate Calcium (STOOL SOFTENER PO) Take 1 tablet by mouth daily at 2 PM.     ferrous sulfate 325 (65 FE) MG tablet Take 1 tablet by mouth 3 (three) times a week.     hydrochlorothiazide (HYDRODIURIL) 25 MG tablet Take 0.5 tablets by mouth daily.     hydrOXYzine (ATARAX/VISTARIL) 10 MG tablet Take 1 tablet by mouth as needed.     insulin glargine (LANTUS) 100 UNIT/ML injection Inject 32 Units into the skin at bedtime.     lisinopril (ZESTRIL) 20 MG  tablet lisinopril 20 mg tablet     lisinopril (ZESTRIL) 40 MG tablet Take 1 tablet by mouth daily.     Omega-3 Fatty Acids (FISH OIL) 1000 MG CAPS Take 2 capsules by mouth 2 (two) times daily.     omeprazole (PRILOSEC) 20 MG capsule omeprazole 20 mg capsule,delayed release     ondansetron (ZOFRAN) 8 MG tablet Take 8 mg by mouth 3 (three) times daily  as needed for nausea or vomiting.     oxybutynin (DITROPAN) 5 MG tablet Take by mouth.     oxybutynin (DITROPAN-XL) 5 MG 24 hr tablet Take 5 mg by mouth at bedtime.     pantoprazole (PROTONIX) 40 MG tablet TAKE ONE TABLET BY MOUTH TWICE A DAY BEFORE MEALS (TAKE ON AN EMPTY STOMACH 30 MINUTES PRIOR TO A MEAL) FOR GERD     pravastatin (PRAVACHOL) 10 MG tablet Take 10 mg by mouth daily.     sildenafil (VIAGRA) 100 MG tablet sildenafil 100 mg tablet     tamsulosin (FLOMAX) 0.4 MG CAPS capsule Take 0.4 mg by mouth 2 (two) times daily.     terazosin (HYTRIN) 5 MG capsule terazosin 5 mg capsule     venlafaxine XR (EFFEXOR-XR) 75 MG 24 hr capsule Take 3 capsules by mouth daily.     vitamin B-12 (CYANOCOBALAMIN) 500 MCG tablet Take 1 tablet by mouth daily.     No current facility-administered medications for this encounter.    REVIEW OF SYSTEMS:  On review of systems, the patient reports that he is doing well overall. He denies any chest pain, shortness of breath, cough, fevers, chills, night sweats, unintended weight changes. He denies any bowel disturbances, and denies abdominal pain, nausea or vomiting. He denies any new musculoskeletal or joint aches or pains. His IPSS was Total Score: 23, indicating severe urinary symptoms. His SHIM: 14, indicating he does have some erectile dysfunction. A complete review of systems is obtained and is otherwise negative.   PHYSICAL EXAM:  Wt Readings from Last 3 Encounters:  03/28/21 146 lb 3.2 oz (66.3 kg)  05/23/20 145 lb 4 oz (65.9 kg)  03/13/15 159 lb (72.1 kg)   Temp Readings from Last 3 Encounters:  03/28/21  97.9 F (36.6 C) (Oral)  06/10/20 97.8 F (36.6 C)  03/13/15 98.4 F (36.9 C) (Oral)   BP Readings from Last 3 Encounters:  03/28/21 120/64  06/10/20 123/67  05/23/20 (!) 126/56   Pulse Readings from Last 3 Encounters:  03/28/21 65  06/10/20 67  05/23/20 78   Pain Assessment Pain Score: 0-No pain/10  In general this is a well appearing gentleman in no acute distress. He's alert and oriented x4 and appropriate throughout the examination. Cardiopulmonary assessment is negative for acute distress, and he exhibits normal effort.    KPS = 100  100 - Normal; no complaints; no evidence of disease. 90   - Able to carry on normal activity; minor signs or symptoms of disease. 80   - Normal activity with effort; some signs or symptoms of disease. 38   - Cares for self; unable to carry on normal activity or to do active work. 60   - Requires occasional assistance, but is able to care for most of his personal needs. 50   - Requires considerable assistance and frequent medical care. 68   - Disabled; requires special care and assistance. 76   - Severely disabled; hospital admission is indicated although death not imminent. 24   - Very sick; hospital admission necessary; active supportive treatment necessary. 10   - Moribund; fatal processes progressing rapidly. 0     - Dead  Karnofsky DA, Abelmann WH, Craver LS and Burchenal [facility] [phone]) The use of the nitrogen mustards in the palliative treatment of carcinoma: with particular reference to bronchogenic carcinoma Cancer 1 [phone]  LABORATORY DATA:  Lab Results  Component Value Date   WBC 9.6 05/23/2020   HGB 11.1 (L)  05/23/2020   HCT 33.6 (L) 05/23/2020   MCV 86.3 05/23/2020   PLT 279.0 05/23/2020   Lab Results  Component Value Date   NA 134 (L) 05/23/2020   K 4.7 05/23/2020   CL 102 05/23/2020   CO2 24 05/23/2020   Lab Results  Component Value Date   ALT 6 05/23/2020   AST 13 05/23/2020   ALKPHOS 102 05/23/2020   BILITOT 0.5  05/23/2020     RADIOGRAPHY: No results found.    IMPRESSION/PLAN: 1. 76 y.o. gentleman with Stage T1c adenocarcinoma of the prostate with Gleason score of 3+4, and PSA of 8.58. We discussed the patient's workup and outlined the nature of prostate cancer in this setting. The patient's T stage, Gleason's score, and PSA put him into the Favorable Intermediate risk group. Accordingly, he is eligible for a variety of potential treatment options including brachytherapy, 5.5 weeks of external radiation, or prostatectomy. We discussed the available radiation techniques, and focused on the details and logistics of delivery.  He would not be an ideal candidate for seed implant given his baseline severe urinary symptoms. We discussed and outlined the risks, benefits, short and long-term effects associated with radiotherapy and compared and contrasted these with prostatectomy.   He appears to have a good understanding of his disease and our treatment recommendations which are of curative intent.  He was encouraged to ask questions that were answered to his stated satisfaction.  At the conclusion of our conversation, the patient is interested in moving forward with consultation with Dr. Alinda Money on 04/08/21 to discuss prostatectomy.  If he elects radiation therapy, I would recommend gold markers, consideration for SpaceOAR and 28 fraction IMRT to the prostate.  We personally spent 60 minutes in this encounter including chart review, reviewing radiological studies, meeting face-to-face with the patient, entering orders and completing documentation.      Tyler Pita, MD  [facility]   Radiation Oncology Direct Dial: [phone]   Fax: [phone] [email or web address]   Skype   LinkedIn

## 2021-03-28 ENCOUNTER — Ambulatory Visit
Admission: RE | Admit: 2021-03-28 | Discharge: 2021-03-28 | Disposition: A | Discharge status: Home / Self Care | Payer: No Typology Code available for payment source | Source: Ambulatory Visit | Attending: Radiation Oncology | Admitting: Radiation Oncology

## 2021-03-28 ENCOUNTER — Other Ambulatory Visit: Payer: Self-pay

## 2021-03-28 ENCOUNTER — Encounter: Payer: Self-pay | Admitting: Radiation Oncology

## 2021-03-28 VITALS — BP 120/64 | HR 65 | Temp 97.9°F | Resp 20 | Ht 66.0 in | Wt 146.2 lb

## 2021-03-28 DIAGNOSIS — N183 Chronic kidney disease, stage 3 unspecified: Secondary | ICD-10-CM | POA: Diagnosis not present

## 2021-03-28 DIAGNOSIS — E119 Type 2 diabetes mellitus without complications: Secondary | ICD-10-CM | POA: Diagnosis not present

## 2021-03-28 DIAGNOSIS — E785 Hyperlipidemia, unspecified: Secondary | ICD-10-CM | POA: Insufficient documentation

## 2021-03-28 DIAGNOSIS — I10 Essential (primary) hypertension: Secondary | ICD-10-CM | POA: Insufficient documentation

## 2021-03-28 DIAGNOSIS — R972 Elevated prostate specific antigen [PSA]: Secondary | ICD-10-CM | POA: Insufficient documentation

## 2021-03-28 DIAGNOSIS — I70213 Atherosclerosis of native arteries of extremities with intermittent claudication, bilateral legs: Secondary | ICD-10-CM | POA: Insufficient documentation

## 2021-03-28 DIAGNOSIS — N401 Enlarged prostate with lower urinary tract symptoms: Secondary | ICD-10-CM | POA: Insufficient documentation

## 2021-03-28 DIAGNOSIS — Z794 Long term (current) use of insulin: Secondary | ICD-10-CM | POA: Insufficient documentation

## 2021-03-28 DIAGNOSIS — I129 Hypertensive chronic kidney disease with stage 1 through stage 4 chronic kidney disease, or unspecified chronic kidney disease: Secondary | ICD-10-CM | POA: Diagnosis not present

## 2021-03-28 DIAGNOSIS — Z800 Family history of malignant neoplasm of digestive organs: Secondary | ICD-10-CM | POA: Diagnosis not present

## 2021-03-28 DIAGNOSIS — I739 Peripheral vascular disease, unspecified: Secondary | ICD-10-CM | POA: Insufficient documentation

## 2021-03-28 DIAGNOSIS — Z7982 Long term (current) use of aspirin: Secondary | ICD-10-CM | POA: Insufficient documentation

## 2021-03-28 DIAGNOSIS — E1122 Type 2 diabetes mellitus with diabetic chronic kidney disease: Secondary | ICD-10-CM | POA: Insufficient documentation

## 2021-03-28 DIAGNOSIS — Z79899 Other long term (current) drug therapy: Secondary | ICD-10-CM | POA: Insufficient documentation

## 2021-03-28 DIAGNOSIS — C61 Malignant neoplasm of prostate: Secondary | ICD-10-CM | POA: Diagnosis not present

## 2021-03-28 DIAGNOSIS — Z7189 Other specified counseling: Secondary | ICD-10-CM | POA: Insufficient documentation

## 2021-03-28 DIAGNOSIS — H2523 Age-related cataract, morgagnian type, bilateral: Secondary | ICD-10-CM | POA: Insufficient documentation

## 2021-03-28 DIAGNOSIS — M545 Low back pain, unspecified: Secondary | ICD-10-CM | POA: Insufficient documentation

## 2021-03-28 DIAGNOSIS — F39 Unspecified mood [affective] disorder: Secondary | ICD-10-CM | POA: Insufficient documentation

## 2021-03-28 DIAGNOSIS — H524 Presbyopia: Secondary | ICD-10-CM | POA: Insufficient documentation

## 2021-03-28 NOTE — Progress Notes (Signed)
Introduced myself to the patient as the prostate nurse navigator.  No barriers to care identified at this time.  He is here to discuss his radiation treatment options. Pt does have a surgical consult with Dr. Alinda Money on 1/31 and I informed patient I would follow up after to access treatment decision. I gave him my business card and asked him to call me with questions or concerns.  Verbalized understanding.

## 2021-04-08 NOTE — Progress Notes (Signed)
RN followed up from patient's consult on 1/20 for Stage T1c adenocarcinoma of the prostate with Gleason score of 3+4, and PSA of 8.58.  Patient saw Dr. Alinda Money on 1/31 and has decided to proceed with active surveillance and will be scheduled for repeat PSA and MRI in the next few months @ [facility].

## 2021-05-05 NOTE — Progress Notes (Signed)
This patient has decided he would like to proceed with daily radiation treatment vs active surveillance.    Had consult with Dr. Tammi Klippel on 1/20 and consult with Dr. Alinda Money on 1/31 for Stage T1c adenocarcinoma of the prostate with Gleason score of 3+4, and PSA of 8.58.   Patient originally elected for AS but now would like to proceed with fiducial markers, SpaceOAR and 28 fraction IMRT to the prostate as discussed on 1/20.  MD's notified of patient's decision, pending appointments at this time.

## 2021-06-02 NOTE — Progress Notes (Signed)
Pt is scheduled for fiducial marker's and spaceOAR on 06/10/2021 with Dr. Claudia Desanctis followed by CT Simulation on 06/27/2021.  ?

## 2021-06-04 ENCOUNTER — Other Ambulatory Visit: Payer: Self-pay

## 2021-06-04 ENCOUNTER — Encounter (HOSPITAL_BASED_OUTPATIENT_CLINIC_OR_DEPARTMENT_OTHER): Payer: Self-pay | Admitting: Urology

## 2021-06-04 ENCOUNTER — Other Ambulatory Visit: Payer: Self-pay | Admitting: Urology

## 2021-06-04 NOTE — Progress Notes (Addendum)
ADDENDUM:  Called and spoke w pt via phone about his surgery time change.  Pt verbalized understanding to arrive at 1200 for surgery start time at 1400 and be npo w/ exception clear liquids until 1100. ? ? ?Spoke w/ via phone for pre-op interview--- pt ?Lab needs dos----  [employer] and ekg             ?Lab results------ no ?COVID test -----patient states asymptomatic no test needed ?Arrive at ------- 0830 on 06-10-2021 ?NPO after MN NO Solid Food.  Clear liquids from MN until--- 0730 ?Med rec completed ?Medications to take morning of surgery ----- oxybutynin, tegretol, protonix, norvasc, effexor, wellbutrin ?Diabetic medication ----- do half dose insulin night before surgery ?Patient instructed no nail polish to be worn day of surgery ?Patient instructed to bring photo id and insurance card day of surgery ?Patient aware to have Driver (ride ) / caregiver for 24 hours after surgery -- friend/ separated wife, sharon Rihn ?Patient Special Instructions ----- will do one fleet enema ?Pre-Op special Istructions ----- n/a ?Patient verbalized understanding of instructions that were given at this phone interview. ?Patient denies shortness of breath, chest pain, fever, cough at this phone interview.  ?

## 2021-06-10 ENCOUNTER — Encounter (HOSPITAL_BASED_OUTPATIENT_CLINIC_OR_DEPARTMENT_OTHER): Payer: Self-pay | Admitting: Urology

## 2021-06-10 ENCOUNTER — Ambulatory Visit (HOSPITAL_BASED_OUTPATIENT_CLINIC_OR_DEPARTMENT_OTHER): Payer: No Typology Code available for payment source | Admitting: Anesthesiology

## 2021-06-10 ENCOUNTER — Ambulatory Visit (HOSPITAL_BASED_OUTPATIENT_CLINIC_OR_DEPARTMENT_OTHER)
Admission: RE | Admit: 2021-06-10 | Discharge: 2021-06-10 | Disposition: A | Discharge status: Home / Self Care | Payer: No Typology Code available for payment source | Attending: Urology | Admitting: Urology

## 2021-06-10 ENCOUNTER — Other Ambulatory Visit: Payer: Self-pay

## 2021-06-10 ENCOUNTER — Encounter (HOSPITAL_BASED_OUTPATIENT_CLINIC_OR_DEPARTMENT_OTHER)
Admission: RE | Disposition: A | Discharge status: Home / Self Care | Payer: Self-pay | Source: Home / Self Care | Attending: Urology

## 2021-06-10 DIAGNOSIS — C61 Malignant neoplasm of prostate: Secondary | ICD-10-CM | POA: Diagnosis not present

## 2021-06-10 DIAGNOSIS — Z87891 Personal history of nicotine dependence: Secondary | ICD-10-CM | POA: Diagnosis not present

## 2021-06-10 DIAGNOSIS — M1990 Unspecified osteoarthritis, unspecified site: Secondary | ICD-10-CM | POA: Insufficient documentation

## 2021-06-10 DIAGNOSIS — K219 Gastro-esophageal reflux disease without esophagitis: Secondary | ICD-10-CM | POA: Insufficient documentation

## 2021-06-10 DIAGNOSIS — N189 Chronic kidney disease, unspecified: Secondary | ICD-10-CM | POA: Diagnosis not present

## 2021-06-10 DIAGNOSIS — Z794 Long term (current) use of insulin: Secondary | ICD-10-CM | POA: Insufficient documentation

## 2021-06-10 DIAGNOSIS — I129 Hypertensive chronic kidney disease with stage 1 through stage 4 chronic kidney disease, or unspecified chronic kidney disease: Secondary | ICD-10-CM | POA: Diagnosis not present

## 2021-06-10 DIAGNOSIS — E1122 Type 2 diabetes mellitus with diabetic chronic kidney disease: Secondary | ICD-10-CM | POA: Diagnosis not present

## 2021-06-10 DIAGNOSIS — Z7984 Long term (current) use of oral hypoglycemic drugs: Secondary | ICD-10-CM | POA: Diagnosis not present

## 2021-06-10 DIAGNOSIS — I10 Essential (primary) hypertension: Secondary | ICD-10-CM | POA: Diagnosis not present

## 2021-06-10 DIAGNOSIS — E1151 Type 2 diabetes mellitus with diabetic peripheral angiopathy without gangrene: Secondary | ICD-10-CM | POA: Insufficient documentation

## 2021-06-10 DIAGNOSIS — G40909 Epilepsy, unspecified, not intractable, without status epilepticus: Secondary | ICD-10-CM | POA: Insufficient documentation

## 2021-06-10 HISTORY — DX: Generalized anxiety disorder: F41.1

## 2021-06-10 HISTORY — DX: Personal history of adenomatous and serrated colon polyps: Z86.0101

## 2021-06-10 HISTORY — DX: Other chronic pain: G89.29

## 2021-06-10 HISTORY — DX: Male erectile dysfunction, unspecified: N52.9

## 2021-06-10 HISTORY — DX: Other intervertebral disc degeneration, lumbar region without mention of lumbar back pain or lower extremity pain: M51.369

## 2021-06-10 HISTORY — DX: Low back pain, unspecified: M54.50

## 2021-06-10 HISTORY — DX: Type 2 diabetes mellitus without complications: E11.9

## 2021-06-10 HISTORY — DX: Diaphragmatic hernia without obstruction or gangrene: K44.9

## 2021-06-10 HISTORY — DX: Benign prostatic hyperplasia with lower urinary tract symptoms: N40.1

## 2021-06-10 HISTORY — DX: Malignant neoplasm of prostate: C61

## 2021-06-10 HISTORY — DX: Major depressive disorder, single episode, unspecified: F32.9

## 2021-06-10 HISTORY — DX: Presence of external hearing-aid: Z97.4

## 2021-06-10 HISTORY — DX: Other intervertebral disc degeneration, lumbar region: M51.36

## 2021-06-10 HISTORY — DX: Unspecified osteoarthritis, unspecified site: M19.90

## 2021-06-10 HISTORY — DX: Gastro-esophageal reflux disease without esophagitis: K21.9

## 2021-06-10 HISTORY — DX: Presence of spectacles and contact lenses: Z97.3

## 2021-06-10 HISTORY — PX: GOLD SEED IMPLANT: SHX6343

## 2021-06-10 HISTORY — DX: Personal history of colonic polyps: Z86.010

## 2021-06-10 HISTORY — DX: Spinal stenosis, lumbar region with neurogenic claudication: M48.062

## 2021-06-10 HISTORY — PX: SPACE OAR INSTILLATION: SHX6769

## 2021-06-10 LAB — POCT I-STAT, CHEM 8
BUN: 57 mg/dL — ABNORMAL HIGH (ref 8–23)
Calcium, Ion: 1.19 mmol/L (ref 1.15–1.40)
Chloride: 102 mmol/L (ref 98–111)
Creatinine, Ser: 2.1 mg/dL — ABNORMAL HIGH (ref 0.61–1.24)
Glucose, Bld: 105 mg/dL — ABNORMAL HIGH (ref 70–99)
HCT: 39 % (ref 39.0–52.0)
Hemoglobin: 13.3 g/dL (ref 13.0–17.0)
Potassium: 5.6 mmol/L — ABNORMAL HIGH (ref 3.5–5.1)
Sodium: 137 mmol/L (ref 135–145)
TCO2: 26 mmol/L (ref 22–32)

## 2021-06-10 LAB — GLUCOSE, CAPILLARY: Glucose-Capillary: 88 mg/dL (ref 70–99)

## 2021-06-10 SURGERY — INSERTION, GOLD SEEDS
Anesthesia: Monitor Anesthesia Care | Site: Prostate

## 2021-06-10 MED ORDER — CEFAZOLIN SODIUM-DEXTROSE 2-4 GM/100ML-% IV SOLN
INTRAVENOUS | Status: AC
  Filled 2021-06-10: qty 100

## 2021-06-10 MED ORDER — MIDAZOLAM HCL 2 MG/2ML IJ SOLN
INTRAMUSCULAR | Status: AC
  Filled 2021-06-10: qty 2

## 2021-06-10 MED ORDER — ONDANSETRON HCL 4 MG/2ML IJ SOLN
INTRAMUSCULAR | Status: DC | PRN
  Administered 2021-06-10: 4 mg via INTRAVENOUS

## 2021-06-10 MED ORDER — FENTANYL CITRATE (PF) 100 MCG/2ML IJ SOLN
INTRAMUSCULAR | Status: AC
  Filled 2021-06-10: qty 2

## 2021-06-10 MED ORDER — ACETAMINOPHEN 500 MG PO TABS
ORAL_TABLET | ORAL | Status: AC
  Filled 2021-06-10: qty 2

## 2021-06-10 MED ORDER — ACETAMINOPHEN 500 MG PO TABS
1000.0000 mg | ORAL_TABLET | Freq: Once | ORAL | Status: AC
  Administered 2021-06-10: 1000 mg via ORAL

## 2021-06-10 MED ORDER — FENTANYL CITRATE (PF) 100 MCG/2ML IJ SOLN: 25.0000 ug | INTRAMUSCULAR | Status: DC | PRN

## 2021-06-10 MED ORDER — PHENYLEPHRINE 40 MCG/ML (10ML) SYRINGE FOR IV PUSH (FOR BLOOD PRESSURE SUPPORT)
PREFILLED_SYRINGE | INTRAVENOUS | Status: AC
  Filled 2021-06-10: qty 10

## 2021-06-10 MED ORDER — FENTANYL CITRATE (PF) 100 MCG/2ML IJ SOLN
INTRAMUSCULAR | Status: DC | PRN
Start: 2021-06-10 — End: 2021-06-10
  Administered 2021-06-10: 50 ug via INTRAVENOUS

## 2021-06-10 MED ORDER — LIDOCAINE HCL 1 % IJ SOLN
INTRAMUSCULAR | Status: DC | PRN
  Administered 2021-06-10: 10 mL

## 2021-06-10 MED ORDER — CEFAZOLIN SODIUM-DEXTROSE 2-4 GM/100ML-% IV SOLN
2.0000 g | Freq: Once | INTRAVENOUS | Status: AC
  Administered 2021-06-10: 2 g via INTRAVENOUS

## 2021-06-10 MED ORDER — PROPOFOL 500 MG/50ML IV EMUL
INTRAVENOUS | Status: DC | PRN
  Administered 2021-06-10: 200 ug/kg/min via INTRAVENOUS

## 2021-06-10 MED ORDER — SODIUM CHLORIDE 0.9 % IV SOLN: INTRAVENOUS | Status: DC

## 2021-06-10 MED ORDER — FLEET ENEMA 7-19 GM/118ML RE ENEM: 1.0000 | ENEMA | Freq: Once | RECTAL | Status: DC

## 2021-06-10 MED ORDER — AMISULPRIDE (ANTIEMETIC) 5 MG/2ML IV SOLN: 10.0000 mg | Freq: Once | INTRAVENOUS | Status: DC | PRN

## 2021-06-10 MED ORDER — PHENYLEPHRINE 40 MCG/ML (10ML) SYRINGE FOR IV PUSH (FOR BLOOD PRESSURE SUPPORT)
PREFILLED_SYRINGE | INTRAVENOUS | Status: DC | PRN
  Administered 2021-06-10: 80 ug via INTRAVENOUS

## 2021-06-10 MED ORDER — PROPOFOL 10 MG/ML IV BOLUS
INTRAVENOUS | Status: AC
  Filled 2021-06-10: qty 20

## 2021-06-10 MED ORDER — PROPOFOL 10 MG/ML IV BOLUS
INTRAVENOUS | Status: DC | PRN
  Administered 2021-06-10: 20 mg via INTRAVENOUS

## 2021-06-10 MED ORDER — MIDAZOLAM HCL 5 MG/5ML IJ SOLN
INTRAMUSCULAR | Status: DC | PRN
  Administered 2021-06-10: 2 mg via INTRAVENOUS

## 2021-06-10 MED ORDER — SODIUM CHLORIDE (PF) 0.9 % IJ SOLN
INTRAMUSCULAR | Status: DC | PRN
Start: 2021-06-10 — End: 2021-06-10
  Administered 2021-06-10: 16 mL via INTRAVENOUS

## 2021-06-10 SURGICAL SUPPLY — 23 items
BLADE CLIPPER SENSICLIP SURGIC (BLADE) ×2 IMPLANT
CNTNR URN SCR LID CUP LEK RST (MISCELLANEOUS) ×1 IMPLANT
CONT SPEC 4OZ STRL OR WHT (MISCELLANEOUS) ×2
COVER BACK TABLE 60X90IN (DRAPES) ×2 IMPLANT
DRSG TEGADERM 4X4.75 (GAUZE/BANDAGES/DRESSINGS) ×2 IMPLANT
DRSG TEGADERM 8X12 (GAUZE/BANDAGES/DRESSINGS) ×2 IMPLANT
GAUZE SPONGE 4X4 12PLY STRL (GAUZE/BANDAGES/DRESSINGS) ×2 IMPLANT
GLOVE SURG ENC MOIS LTX SZ6.5 (GLOVE) ×2 IMPLANT
IMPL SPACEOAR VUE SYSTEM (Spacer) ×1 IMPLANT
IMPLANT SPACEOAR VUE SYSTEM (Spacer) ×2 IMPLANT
KIT TURNOVER CYSTO (KITS) ×2 IMPLANT
MARKER GOLD PRELOAD 1.2X3 (Urological Implant) ×1 IMPLANT
MARKER SKIN DUAL TIP RULER LAB (MISCELLANEOUS) ×2 IMPLANT
NDL SPNL 22GX7 QUINCKE BK (NEEDLE) ×1 IMPLANT
NEEDLE SPNL 22GX7 QUINCKE BK (NEEDLE) ×2 IMPLANT
SEED GOLD PRELOAD 1.2X3 (Urological Implant) ×2 IMPLANT
SHEATH ULTRASOUND LF (SHEATH) IMPLANT
SHEATH ULTRASOUND LTX NONSTRL (SHEATH) IMPLANT
SURGILUBE 2OZ TUBE FLIPTOP (MISCELLANEOUS) ×2 IMPLANT
SYR 10ML LL (SYRINGE) ×1 IMPLANT
SYR CONTROL 10ML LL (SYRINGE) ×2 IMPLANT
TOWEL OR 17X26 10 PK STRL BLUE (TOWEL DISPOSABLE) ×2 IMPLANT
UNDERPAD 30X36 HEAVY ABSORB (UNDERPADS AND DIAPERS) ×2 IMPLANT

## 2021-06-10 NOTE — Interval H&P Note (Signed)
History and Physical Interval Note: ? ?06/10/2021 ?12:47 PM ? ?George Harrell  has presented today for surgery, with the diagnosis of PROSTATE CANCER.  The various methods of treatment have been discussed with the patient and family. After consideration of risks, benefits and other options for treatment, the patient has consented to  Procedure(s): ?GOLD SEED IMPLANT (N/A) ?SPACE OAR INSTILLATION (N/A) as a surgical intervention.  The patient's history has been reviewed, patient examined, no change in status, stable for surgery.  I have reviewed the patient's chart and labs.  Questions were answered to the patient's satisfaction.   ? ? ?Bridey Brookover D Beauty Pless ? ? ?

## 2021-06-10 NOTE — Transfer of Care (Signed)
Immediate Anesthesia Transfer of Care Note ? ?Patient: George Harrell ? ?Procedure(s) Performed: GOLD SEED IMPLANT (Prostate) ?SPACE OAR INSTILLATION (Prostate) ? ?Patient Location: PACU ? ?Anesthesia Type:MAC ? ?Level of Consciousness: awake, alert  and oriented ? ?Airway & Oxygen Therapy: Patient Spontanous Breathing and Patient connected to face mask oxygen ? ?Post-op Assessment: Report given to RN and Post -op Vital signs reviewed and stable ? ?Post vital signs: Reviewed and stable ? ?Last Vitals:  ?Vitals Value Taken Time  ?BP 95/56 06/10/21 1408  ?Temp    ?Pulse 65 06/10/21 1410  ?Resp 17 06/10/21 1410  ?SpO2 100 % 06/10/21 1410  ?Vitals shown include unvalidated device data. ? ?Last Pain:  ?Vitals:  ? 06/10/21 1233  ?TempSrc: Oral  ?PainSc: 7   ?   ? ?Patients Stated Pain Goal: 8 (06/10/21 1233) ? ?Complications: No notable events documented. ?

## 2021-06-10 NOTE — Op Note (Signed)
Preoperative diagnosis: Clinically localized adenocarcinoma of the prostate ([id number]) ? ?Postoperative diagnosis: Clinically localized adenocarcinoma of the prostate ? ?Procedure: 1) Placement of fiducial markers into prostate ?                   2) Insertion of SpaceOAR hydrogel  ? ?Surgeon: Jacalyn Lefevre, MD ? ? ?Anesthesia: General ? ?EBL: Minimal ? ?Complications: None ? ?Indication: George Harrell is a 76 y.o. gentleman with clinically localized prostate cancer. After discussing management options for treatment, he elected to proceed with radiotherapy. He presents today for the above procedures. The potential risks, complications, alternative options, and expected recovery course have been discussed in detail with the patient and he has provided informed consent to proceed. ? ?Description of procedure: The patient was administered preoperative antibiotics, placed in the dorsal lithotomy position, and prepped and draped in the usual sterile fashion. Next, transrectal ultrasonography was utilized to visualize the prostate. Three gold fiducial markers were then placed into the prostate via transperineal needles under ultrasound guidance at the right apex, right base, and left mid gland under direct ultrasound guidance. A site in the midline was then selected on the perineum for placement of an 18 g needle with saline. The needle was advanced above the rectum and below Denonvillier's fascia to the mid gland and confirmed to be in the midline on transverse imaging. One cc of saline was injected confirming appropriate expansion of this space. A total of 15 cc of saline was then injected to open the space further bilaterally. The saline syringe was then removed and the SpaceOAR hydrogel was injected with good distribution bilaterally. He tolerated the procedure well and without complications. He was given a voiding trial prior to discharge from the PACU.  ?

## 2021-06-10 NOTE — Discharge Instructions (Addendum)
DISCHARGE INSTRUCTIONS FOR PROSTATE GOLD Markers and SpaceOAR ? ?Diet ?Resume your usual diet when you return home. To keep your bowels moving easily and softly, drink prune, apple and cranberry juice at room temperature. You may also take a stool softener, such as Colace, which is available without prescription at local pharmacies. ? ?Daily activities ?No driving or heavy lifting for at least two days after the implant. ?No bike riding, horseback riding or riding lawn mowers for the first month after the implant. ?Any strenuous physical activity should be approved by your doctor before you resume it. ? ?Sexual relations ?You may resume sexual relations two weeks after the procedure. Your semen may be dark brown or black; this is normal and is related bleeding that may have occurred during the implant. ? ?Postoperative swelling ?Expect swelling and bruising of the scrotum and perineum (the area between the scrotum and anus). Both the swelling and the bruising should resolve in l or 2 weeks. Ice packs and over- the-counter medications such as Tylenol, Advil or Aleve may lessen your discomfort. ? ?Postoperative urination ?Most men experience burning on urination and/or urinary frequency. If this becomes bothersome, contact your Urologist.  Medication can be prescribed to relieve these problems.  It is normal to have some blood in your urine for a few days after the implant. ? ?Post Anesthesia Home Care Instructions ? ?Activity: ?Get plenty of rest for the remainder of the day. A responsible adult should stay with you for 24 hours following the procedure.  ?For the next 24 hours, DO NOT: ?-Drive a car ?-Paediatric nurse ?-Drink alcoholic beverages ?-Take any medication unless instructed by your physician ?-Make any legal decisions or sign important papers. ? ?Meals: ?Start with liquid foods such as gelatin or soup. Progress to regular foods as tolerated. Avoid greasy, spicy, heavy foods. If nausea and/or vomiting  occur, drink only clear liquids until the nausea and/or vomiting subsides. Call your physician if vomiting continues. ? ?Special Instructions/Symptoms: ?Your throat may feel dry or sore from the anesthesia or the breathing tube placed in your throat during surgery. If this causes discomfort, gargle with warm salt water. The discomfort should disappear within 24 hours. ? ?If you had a scopolamine patch placed behind your ear for the management of post- operative nausea and/or vomiting: ? ?1. The medication in the patch is effective for 72 hours, after which it should be removed.  Wrap patch in a tissue and discard in the trash. Wash hands thoroughly with soap and water. ?2. You may remove the patch earlier than 72 hours if you experience unpleasant side effects which may include dry mouth, dizziness or visual disturbances. ?3. Avoid touching the patch. Wash your hands with soap and water after contact with the patch. ?   ? ?

## 2021-06-10 NOTE — H&P (Signed)
CC/HPI: CC: Prostate Cancer  ? ?Physician requesting consult: Dr. Alphia Harrell  ?PCP:  ? ?[name] is a 76 year old gentleman who was found to have an elevated PSA of 8.58 prompting a TRUS biopsy of the prostate on 02/18/21. This confirmed Gleason 3+4=7 adenocarcinoma with 8 out of 14 biopsies positive for malignancy. On the right side, 15 to 20% of the tissue was involved and positive for Gleason 3+3 = 6 adenocarcinoma. On the left side, 50% of the tissue was involved and it was read as Gleason 3+4 = 7 adenocarcinoma although only 5% of the tissue was noted to be pattern 4. He did see Dr. Tammi Harrell last week to discuss radiation therapy options.  ? ?Family history: Brother - diagnosed in his early 40s.  ? ?Imaging studies: None.  ? ?PMH: He has a history of PVD s/p LLE stent placement in June 2021, hypertension, hyperlipidemia, anxiety, depression, diabetes, chronic kidney disease with a baseline creatinine of 1.67, GERD, and chronic back pain.  ?PSH: No abdominal surgeries.  ? ?TNM stage: cT1c Nx Mx  ?PSA: 8.58  ?Gleason score: 3+4=7 (GG 2)  ?Biopsy (02/18/21, George [facility]): 8/14 cores positive  ?Left: 4/7 cores positive, 3+4=7, 50% of tissue (5% pattern 4)  ?Right: 4/7 cores, 3+3=6, 15-20% of tissue  ?Prostate volume: 40 cc  ?PSAD: 0.21  ? ?Nomogram  ?OC disease: 37%  ?EPE: 62%  ?SVI: 10%  ?LNI: 8%  ?PFS (5 year, 10 year): 74%, 60%  ? ?Urinary function: IPSS is 22.  ?Erectile function: SHIM score is 2. He states that he does take sildenafil which is helpful but he still has difficulty obtaining adequate erections.  ? ?  ?ALLERGIES: Simvastatin ?  ? ?MEDICATIONS: Aspirin  ?Lisinopril  ?Terazosin Hcl  ?Bupropion Hcl  ?Gabapentin  ?Hydroxyzine Hcl  ?Lantus 100 unit/ml vial  ?Pantoprazole Sodium  ?Pravastatin Sodium  ?Venlafaxine Hcl  ?Vitamin D2  ?  ? ?GU PSH: None  ? ?NON-GU PSH: Anesth, Upper Arm Surgery ?Back Surgery (Unspecified) ? ?  ? ?GU PMH: History of prostate cancer ?  ? ?NON-GU PMH:  Anxiety ?Depression ?Diabetes Type 2 ?DVT, History ?GERD ?Glaucoma ?Hypercholesterolemia ?Hypertension ?  ? ?FAMILY HISTORY: 2 daughters - Runs in Family  ? ?SOCIAL HISTORY: Marital Status: Married ?Preferred Language: [place]; Ethnicity: Not Hispanic Or Latino; Race: Black or African American ?Current Smoking Status: Patient does not smoke anymore.  ? ?Tobacco Use Assessment Completed: Used Tobacco in last 30 days? ?Has never drank.  ?Drinks 1 caffeinated drink per day. ?  ? ?REVIEW OF SYSTEMS:    ?GU Review Male:   Patient reports get up at night to urinate, burning/ pain with urination, and frequent urination. Patient denies leakage of urine, stream starts and stops, trouble starting your streams, have to strain to urinate , and hard to postpone urination.  ?Gastrointestinal (Upper):   Patient reports nausea. Patient denies vomiting.  ?Gastrointestinal (Lower):   Patient reports constipation. Patient denies diarrhea.  ?Constitutional:   Patient reports weight loss and fatigue. Patient denies fever and night sweats.  ?Skin:   Patient reports itching. Patient denies skin rash/ lesion.  ?Eyes:   Patient denies blurred vision and double vision.  ?Ears/ Nose/ Throat:   Patient denies sore throat and sinus problems.  ?Hematologic/Lymphatic:   Patient denies swollen glands and easy bruising.  ?Cardiovascular:   Patient denies leg swelling and chest pains.  ?Respiratory:   Patient denies cough and shortness of breath.  ?Endocrine:   Patient denies excessive thirst.  ?  Musculoskeletal:   Patient reports back pain. Patient denies joint pain.  ?Neurological:   Patient reports dizziness. Patient denies headaches.  ?Psychologic:   Patient reports depression and anxiety.   ? ?VITAL SIGNS: None  ? ?GU PHYSICAL EXAMINATION:    ?Prostate: Prostate about 50 grams. Left lobe normal consistency, right lobe normal consistency. Symmetrical lobes. No prostate nodule. Left lobe no tenderness, right lobe no tenderness.   ? ?MULTI-SYSTEM  PHYSICAL EXAMINATION:    ?Constitutional: Well-nourished. No physical deformities. Normally developed. Good grooming.  ?Respiratory: No labored breathing, no use of accessory muscles. Clear bilaterally.  ?Cardiovascular: Normal temperature, normal extremity pulses, no swelling, no varicosities. Regular rate and rhythm.  ?Gastrointestinal: No mass, no tenderness, no rigidity, non obese abdomen.   ? ?  ?Complexity of Data:  ?Lab Test Review:   PSA  ?Records Review:   Pathology Reports, Previous Patient Records  ?Urodynamics Review:   Review Bladder Scan  ? ?PROCEDURES:    ?     PVR Ultrasound - 33825  ?Scanned Volume: 83 cc  ? ?ASSESSMENT:  ?    ICD-10 Details  ?1 GU:   Prostate Cancer - C61   ? ?PLAN:    ? ?      Orders ? ? ?      Schedule ?Labs: 4 Months - Urinalysis  ?  4 Months - PSA  ?X-Rays: 4 Months - MRI Prostate GSORAD With and Without I.V. Contrast  ?Return Visit/Planned Activity: 4 Months - Office Visit  ? ? ?      Document ?Letter(s):  Created for Patient: Clinical Summary  ? ? ?     Notes:   1. Prostate cancer: I had a detailed discussion with [name] today regarding his prostate cancer situation. He is well-informed through his prior discussions with Dr. Tammi Harrell and Dr. Domenica Harrell. The patient was counseled about the natural history of prostate cancer and the standard treatment options that are available for prostate cancer. It was explained to him how his age and life expectancy, clinical stage, Gleason score/prognostic grade group, and PSA (and PSA density) affect his prognosis, the decision to proceed with additional staging studies, as well as how that information influences recommended treatment strategies. We discussed the roles for active surveillance, radiation therapy, surgical therapy, androgen deprivation, as well as ablative therapy and other investigational options for the treatment of prostate cancer as appropriate to his individual cancer situation. We discussed the risks and benefits of  these options with regard to their impact on cancer control and also in terms of potential adverse events, complications, and impact on quality of life particularly related to urinary and sexual function. The patient was encouraged to ask questions throughout the discussion today and all questions were answered to his stated satisfaction. In addition, the patient was provided with and/or directed to appropriate resources and literature for further education about prostate cancer and treatment options.  ? ?Ultimately, we reviewed our goal of letting him live out his life span without developing symptomatic disease or death related to his cancer. Considering he has likely about a 10-year life expectancy, he understands that this may be possible with active surveillance management alone. We therefore discussed the pros and cons of active surveillance management versus curative therapy with either surgery or radiation treatment. After reviewing these options and the potential risks of treatment in more detail, he is most interested in an active surveillance management strategy to begin with. As such, he will be scheduled for a repeat PSA  and MRI of the prostate over the next few months. We will then consider a confirmatory biopsy at some [facility] later this year pending those results. We discussed some of the indications to consider changing from surveillance to definitive therapy.  ? ?2. BPH/LUTS: He states that his symptoms have not been improved taking both tamsulosin and oxybutynin. He is therefore going to sequentially try to stop each of these medications to see if they are helping him or not. His PVR was quite acceptable today. He was also provided samples for Myrbetriq 50 mg and will try this should he be able to get off his other medications. He will then follow-up with a PVR/IPSS questionnaire at his next visit and we will continue to try to optimize his lower urinary tract symptoms.  ? ?  ? ?

## 2021-06-10 NOTE — Anesthesia Postprocedure Evaluation (Signed)
Anesthesia Post Note ? ?Patient: SHEM PLEMMONS ? ?Procedure(s) Performed: GOLD SEED IMPLANT (Prostate) ?SPACE OAR INSTILLATION (Prostate) ? ?  ? ?Patient location during evaluation: PACU ?Anesthesia Type: MAC ?Level of consciousness: awake and alert ?Pain management: pain level controlled ?Vital Signs Assessment: post-procedure vital signs reviewed and stable ?Respiratory status: spontaneous breathing, nonlabored ventilation, respiratory function stable and patient connected to nasal cannula oxygen ?Cardiovascular status: stable and blood pressure returned to baseline ?Postop Assessment: no apparent nausea or vomiting ?Anesthetic complications: no ? ? ?No notable events documented. ? ?Last Vitals:  ?Vitals:  ? 06/10/21 1430 06/10/21 1445  ?BP: (!) 102/54 104/62  ?Pulse: 63 63  ?Resp: 15 19  ?Temp:    ?SpO2: 100% 97%  ?  ?Last Pain:  ?Vitals:  ? 06/10/21 1445  ?TempSrc:   ?PainSc: 0-No pain  ? ? ?  ?  ?  ?  ?  ?  ? ?Suzette Battiest E ? ? ? ? ?

## 2021-06-10 NOTE — Anesthesia Preprocedure Evaluation (Signed)
Anesthesia Evaluation  ?Patient identified by MRN, date of birth, ID band ?Patient awake ? ? ? ?Reviewed: ?Allergy & Precautions, NPO status , Patient's Chart, lab work & pertinent test results ? ?Airway ?Mallampati: II ? ?TM Distance: >3 FB ?Neck ROM: Full ? ? ? Dental ? ?(+) Dental Advisory Given ?  ?Pulmonary ?former smoker,  ?  ?breath sounds clear to auscultation ? ? ? ? ? ? Cardiovascular ?hypertension, Pt. on medications ?+ Peripheral Vascular Disease  ? ?Rhythm:Regular Rate:Normal ? ? ?  ?Neuro/Psych ?Seizures -,    ? GI/Hepatic ?Neg liver ROS, hiatal hernia, GERD  ,  ?Endo/Other  ?diabetes, Type 2, Insulin Dependent, Oral Hypoglycemic Agents ? Renal/GU ?CRFRenal disease  ? ?  ?Musculoskeletal ? ?(+) Arthritis ,  ? Abdominal ?  ?Peds ? Hematology ?negative hematology ROS ?(+)   ?Anesthesia Other Findings ? ? Reproductive/Obstetrics ? ?  ? ? ? ? ? ? ? ? ? ? ? ? ? ?  ?  ? ? ? ? ? ? ? ? ?Anesthesia Physical ?Anesthesia Plan ? ?ASA: 3 ? ?Anesthesia Plan: MAC  ? ?Post-op Pain Management: Tylenol PO (pre-op)* and Minimal or no pain anticipated  ? ?Induction:  ? ?PONV Risk Score and Plan: 1 and Propofol infusion, Ondansetron and Treatment may vary due to age or medical condition ? ?Airway Management Planned: Natural Airway and Simple Face Mask ? ?Additional Equipment:  ? ?Intra-op Plan:  ? ?Post-operative Plan:  ? ?Informed Consent: I have reviewed the patients History and Physical, chart, labs and discussed the procedure including the risks, benefits and alternatives for the proposed anesthesia with the patient or authorized representative who has indicated his/her understanding and acceptance.  ? ? ? ? ? ?Plan Discussed with: CRNA ? ?Anesthesia Plan Comments:   ? ? ? ? ? ? ?Anesthesia Quick Evaluation ? ?

## 2021-06-11 ENCOUNTER — Encounter (HOSPITAL_BASED_OUTPATIENT_CLINIC_OR_DEPARTMENT_OTHER): Payer: Self-pay | Admitting: Urology

## 2021-06-26 ENCOUNTER — Telehealth: Payer: Self-pay | Admitting: *Deleted

## 2021-06-26 NOTE — Telephone Encounter (Signed)
Called patient to remind of sim appt. for 06-27-21- arrival time- 8:45 am @ [facility] ([facility] informed patient to arrive with a full bladder and an empty bowel, lvm for a return call ?

## 2021-06-27 ENCOUNTER — Other Ambulatory Visit: Payer: Self-pay

## 2021-06-27 ENCOUNTER — Ambulatory Visit
Admission: RE | Admit: 2021-06-27 | Discharge: 2021-06-27 | Disposition: A | Discharge status: Home / Self Care | Payer: No Typology Code available for payment source | Source: Ambulatory Visit | Attending: Radiation Oncology | Admitting: Radiation Oncology

## 2021-06-27 DIAGNOSIS — C61 Malignant neoplasm of prostate: Secondary | ICD-10-CM | POA: Insufficient documentation

## 2021-06-29 NOTE — Progress Notes (Signed)
?  Radiation Oncology         [phone] ?________________________________ ? ?Name: George Harrell MRN: [id number]  ?Date: 06/27/2021  DOB: 1945/11/05 ? ?SIMULATION AND TREATMENT PLANNING NOTE ? ?  ICD-10-CM   ?1. Malignant neoplasm of prostate ([place])  C61   ?  ? ? ?DIAGNOSIS:  76 y.o. gentleman with Stage T1c adenocarcinoma of the prostate with Gleason score of 3+4, and PSA of 8.58. ? ?NARRATIVE:  The patient was brought to the [place].  Identity was confirmed.  All relevant records and images related to the planned course of therapy were reviewed.  The patient freely provided informed written consent to proceed with treatment after reviewing the details related to the planned course of therapy. The consent form was witnessed and verified by the simulation staff.  Then, the patient was set-up in a stable reproducible supine position for radiation therapy.  A vacuum lock pillow device was custom fabricated to position his legs in a reproducible immobilized position.  Then, I performed a urethrogram under sterile conditions to identify the prostatic apex.  CT images were obtained.  Surface markings were placed.  The CT images were loaded into the planning software.  Then the prostate target and avoidance structures including the rectum, bladder, bowel and hips were contoured.  Treatment planning then occurred.  The radiation prescription was entered and confirmed.  A total of one complex treatment devices was fabricated. I have requested : Intensity Modulated Radiotherapy (IMRT) is medically necessary for this case for the following reason:  Rectal sparing.. ? ?PLAN:  The patient will receive 70 Gy in 28 fractions. ? ?________________________________ ? ?Sheral Apley Tammi Klippel, M.D. ? ?

## 2021-07-07 DIAGNOSIS — C61 Malignant neoplasm of prostate: Secondary | ICD-10-CM | POA: Insufficient documentation

## 2021-07-10 ENCOUNTER — Other Ambulatory Visit: Payer: Self-pay

## 2021-07-10 ENCOUNTER — Ambulatory Visit
Admission: RE | Admit: 2021-07-10 | Discharge: 2021-07-10 | Disposition: A | Discharge status: Home / Self Care | Payer: No Typology Code available for payment source | Source: Ambulatory Visit | Attending: Radiation Oncology | Admitting: Radiation Oncology

## 2021-07-10 DIAGNOSIS — C61 Malignant neoplasm of prostate: Secondary | ICD-10-CM | POA: Diagnosis not present

## 2021-07-10 LAB — RAD ONC ARIA SESSION SUMMARY
Course Elapsed Days: 0
Plan Fractions Treated to Date: 1
Plan Prescribed Dose Per Fraction: 2.5 Gy
Plan Total Fractions Prescribed: 28
Plan Total Prescribed Dose: 70 Gy
Reference Point Dosage Given to Date: 2.5 Gy
Reference Point Session Dosage Given: 2.5 Gy
Session Number: 1

## 2021-07-11 ENCOUNTER — Ambulatory Visit
Admission: RE | Admit: 2021-07-11 | Discharge: 2021-07-11 | Disposition: A | Discharge status: Home / Self Care | Payer: No Typology Code available for payment source | Source: Ambulatory Visit | Attending: Radiation Oncology | Admitting: Radiation Oncology

## 2021-07-11 ENCOUNTER — Other Ambulatory Visit: Payer: Self-pay

## 2021-07-11 DIAGNOSIS — C61 Malignant neoplasm of prostate: Secondary | ICD-10-CM | POA: Diagnosis not present

## 2021-07-11 LAB — RAD ONC ARIA SESSION SUMMARY
Course Elapsed Days: 1
Plan Fractions Treated to Date: 2
Plan Prescribed Dose Per Fraction: 2.5 Gy
Plan Total Fractions Prescribed: 28
Plan Total Prescribed Dose: 70 Gy
Reference Point Dosage Given to Date: 5 Gy
Reference Point Session Dosage Given: 2.5 Gy
Session Number: 2

## 2021-07-11 NOTE — Progress Notes (Signed)
Pt here for patient teaching. Pt given Radiation and You booklet and skin care instructions.  Reviewed areas of pertinence such as diarrhea, fatigue, hair loss, nausea and vomiting, sexual and fertility changes, skin changes, urinary and bladder changes, earaches, and taste changes. Pt able to give teach back of to pat skin, use unscented/gentle soap, use baby wipes, have Imodium on hand, drink plenty of water, and sitz bath, avoid applying anything to skin within 4 hours of treatment and to use an electric razor if they must shave. Pt verbalizes understanding of information given and will contact nursing with any questions or concerns.   ? ? [email or web address] ? ? ? ? ? ?

## 2021-07-14 ENCOUNTER — Other Ambulatory Visit: Payer: Self-pay

## 2021-07-14 ENCOUNTER — Ambulatory Visit
Admission: RE | Admit: 2021-07-14 | Discharge: 2021-07-14 | Disposition: A | Discharge status: Home / Self Care | Payer: No Typology Code available for payment source | Source: Ambulatory Visit | Attending: Radiation Oncology | Admitting: Radiation Oncology

## 2021-07-14 DIAGNOSIS — C61 Malignant neoplasm of prostate: Secondary | ICD-10-CM | POA: Diagnosis not present

## 2021-07-14 LAB — RAD ONC ARIA SESSION SUMMARY
Course Elapsed Days: 4
Plan Fractions Treated to Date: 3
Plan Prescribed Dose Per Fraction: 2.5 Gy
Plan Total Fractions Prescribed: 28
Plan Total Prescribed Dose: 70 Gy
Reference Point Dosage Given to Date: 7.5 Gy
Reference Point Session Dosage Given: 2.5 Gy
Session Number: 3

## 2021-07-15 ENCOUNTER — Other Ambulatory Visit: Payer: Self-pay

## 2021-07-15 ENCOUNTER — Ambulatory Visit
Admission: RE | Admit: 2021-07-15 | Discharge: 2021-07-15 | Disposition: A | Discharge status: Home / Self Care | Payer: No Typology Code available for payment source | Source: Ambulatory Visit | Attending: Radiation Oncology | Admitting: Radiation Oncology

## 2021-07-15 DIAGNOSIS — C61 Malignant neoplasm of prostate: Secondary | ICD-10-CM | POA: Diagnosis not present

## 2021-07-15 LAB — RAD ONC ARIA SESSION SUMMARY
Course Elapsed Days: 5
Plan Fractions Treated to Date: 4
Plan Prescribed Dose Per Fraction: 2.5 Gy
Plan Total Fractions Prescribed: 28
Plan Total Prescribed Dose: 70 Gy
Reference Point Dosage Given to Date: 10 Gy
Reference Point Session Dosage Given: 2.5 Gy
Session Number: 4

## 2021-07-16 ENCOUNTER — Ambulatory Visit
Admission: RE | Admit: 2021-07-16 | Discharge: 2021-07-16 | Disposition: A | Discharge status: Home / Self Care | Payer: No Typology Code available for payment source | Source: Ambulatory Visit | Attending: Radiation Oncology | Admitting: Radiation Oncology

## 2021-07-16 ENCOUNTER — Other Ambulatory Visit: Payer: Self-pay

## 2021-07-16 DIAGNOSIS — C61 Malignant neoplasm of prostate: Secondary | ICD-10-CM | POA: Diagnosis not present

## 2021-07-16 LAB — RAD ONC ARIA SESSION SUMMARY
Course Elapsed Days: 6
Plan Fractions Treated to Date: 5
Plan Prescribed Dose Per Fraction: 2.5 Gy
Plan Total Fractions Prescribed: 28
Plan Total Prescribed Dose: 70 Gy
Reference Point Dosage Given to Date: 12.5 Gy
Reference Point Session Dosage Given: 2.5 Gy
Session Number: 5

## 2021-07-17 ENCOUNTER — Ambulatory Visit: Payer: No Typology Code available for payment source

## 2021-07-18 ENCOUNTER — Other Ambulatory Visit: Payer: Self-pay

## 2021-07-18 ENCOUNTER — Ambulatory Visit
Admission: RE | Admit: 2021-07-18 | Discharge: 2021-07-18 | Disposition: A | Discharge status: Home / Self Care | Payer: No Typology Code available for payment source | Source: Ambulatory Visit | Attending: Radiation Oncology | Admitting: Radiation Oncology

## 2021-07-18 DIAGNOSIS — C61 Malignant neoplasm of prostate: Secondary | ICD-10-CM | POA: Diagnosis not present

## 2021-07-18 LAB — RAD ONC ARIA SESSION SUMMARY
Course Elapsed Days: 8
Plan Fractions Treated to Date: 6
Plan Prescribed Dose Per Fraction: 2.5 Gy
Plan Total Fractions Prescribed: 28
Plan Total Prescribed Dose: 70 Gy
Reference Point Dosage Given to Date: 15 Gy
Reference Point Session Dosage Given: 2.5 Gy
Session Number: 6

## 2021-07-21 ENCOUNTER — Other Ambulatory Visit: Payer: Self-pay

## 2021-07-21 ENCOUNTER — Ambulatory Visit
Admission: RE | Admit: 2021-07-21 | Discharge: 2021-07-21 | Disposition: A | Discharge status: Home / Self Care | Payer: No Typology Code available for payment source | Source: Ambulatory Visit | Attending: Radiation Oncology | Admitting: Radiation Oncology

## 2021-07-21 DIAGNOSIS — C61 Malignant neoplasm of prostate: Secondary | ICD-10-CM | POA: Diagnosis not present

## 2021-07-21 LAB — RAD ONC ARIA SESSION SUMMARY
Course Elapsed Days: 11
Plan Fractions Treated to Date: 7
Plan Prescribed Dose Per Fraction: 2.5 Gy
Plan Total Fractions Prescribed: 28
Plan Total Prescribed Dose: 70 Gy
Reference Point Dosage Given to Date: 17.5 Gy
Reference Point Session Dosage Given: 2.5 Gy
Session Number: 7

## 2021-07-22 ENCOUNTER — Ambulatory Visit
Admission: RE | Admit: 2021-07-22 | Discharge: 2021-07-22 | Disposition: A | Discharge status: Home / Self Care | Payer: No Typology Code available for payment source | Source: Ambulatory Visit | Attending: Radiation Oncology | Admitting: Radiation Oncology

## 2021-07-22 ENCOUNTER — Other Ambulatory Visit: Payer: Self-pay

## 2021-07-22 DIAGNOSIS — C61 Malignant neoplasm of prostate: Secondary | ICD-10-CM | POA: Diagnosis not present

## 2021-07-22 LAB — RAD ONC ARIA SESSION SUMMARY
Course Elapsed Days: 12
Plan Fractions Treated to Date: 8
Plan Prescribed Dose Per Fraction: 2.5 Gy
Plan Total Fractions Prescribed: 28
Plan Total Prescribed Dose: 70 Gy
Reference Point Dosage Given to Date: 20 Gy
Reference Point Session Dosage Given: 2.5 Gy
Session Number: 8

## 2021-07-23 ENCOUNTER — Other Ambulatory Visit: Payer: Self-pay

## 2021-07-23 ENCOUNTER — Ambulatory Visit
Admission: RE | Admit: 2021-07-23 | Discharge: 2021-07-23 | Disposition: A | Discharge status: Home / Self Care | Payer: No Typology Code available for payment source | Source: Ambulatory Visit | Attending: Radiation Oncology | Admitting: Radiation Oncology

## 2021-07-23 DIAGNOSIS — C61 Malignant neoplasm of prostate: Secondary | ICD-10-CM | POA: Diagnosis not present

## 2021-07-23 LAB — RAD ONC ARIA SESSION SUMMARY
Course Elapsed Days: 13
Plan Fractions Treated to Date: 9
Plan Prescribed Dose Per Fraction: 2.5 Gy
Plan Total Fractions Prescribed: 28
Plan Total Prescribed Dose: 70 Gy
Reference Point Dosage Given to Date: 22.5 Gy
Reference Point Session Dosage Given: 2.5 Gy
Session Number: 9

## 2021-07-24 ENCOUNTER — Other Ambulatory Visit: Payer: Self-pay

## 2021-07-24 ENCOUNTER — Ambulatory Visit
Admission: RE | Admit: 2021-07-24 | Discharge: 2021-07-24 | Disposition: A | Discharge status: Home / Self Care | Payer: No Typology Code available for payment source | Source: Ambulatory Visit | Attending: Radiation Oncology | Admitting: Radiation Oncology

## 2021-07-24 DIAGNOSIS — C61 Malignant neoplasm of prostate: Secondary | ICD-10-CM | POA: Diagnosis not present

## 2021-07-24 LAB — RAD ONC ARIA SESSION SUMMARY
Course Elapsed Days: 14
Plan Fractions Treated to Date: 10
Plan Prescribed Dose Per Fraction: 2.5 Gy
Plan Total Fractions Prescribed: 28
Plan Total Prescribed Dose: 70 Gy
Reference Point Dosage Given to Date: 25 Gy
Reference Point Session Dosage Given: 2.5 Gy
Session Number: 10

## 2021-07-25 ENCOUNTER — Other Ambulatory Visit: Payer: Self-pay

## 2021-07-25 ENCOUNTER — Ambulatory Visit
Admission: RE | Admit: 2021-07-25 | Discharge: 2021-07-25 | Disposition: A | Discharge status: Home / Self Care | Payer: No Typology Code available for payment source | Source: Ambulatory Visit | Attending: Radiation Oncology | Admitting: Radiation Oncology

## 2021-07-25 DIAGNOSIS — C61 Malignant neoplasm of prostate: Secondary | ICD-10-CM | POA: Diagnosis not present

## 2021-07-25 LAB — RAD ONC ARIA SESSION SUMMARY
Course Elapsed Days: 15
Plan Fractions Treated to Date: 11
Plan Prescribed Dose Per Fraction: 2.5 Gy
Plan Total Fractions Prescribed: 28
Plan Total Prescribed Dose: 70 Gy
Reference Point Dosage Given to Date: 27.5 Gy
Reference Point Session Dosage Given: 2.5 Gy
Session Number: 11

## 2021-07-28 ENCOUNTER — Other Ambulatory Visit: Payer: Self-pay

## 2021-07-28 ENCOUNTER — Ambulatory Visit
Admission: RE | Admit: 2021-07-28 | Discharge: 2021-07-28 | Disposition: A | Discharge status: Home / Self Care | Payer: No Typology Code available for payment source | Source: Ambulatory Visit | Attending: Radiation Oncology | Admitting: Radiation Oncology

## 2021-07-28 DIAGNOSIS — C61 Malignant neoplasm of prostate: Secondary | ICD-10-CM | POA: Diagnosis not present

## 2021-07-28 LAB — RAD ONC ARIA SESSION SUMMARY
Course Elapsed Days: 18
Plan Fractions Treated to Date: 12
Plan Prescribed Dose Per Fraction: 2.5 Gy
Plan Total Fractions Prescribed: 28
Plan Total Prescribed Dose: 70 Gy
Reference Point Dosage Given to Date: 30 Gy
Reference Point Session Dosage Given: 2.5 Gy
Session Number: 12

## 2021-07-29 ENCOUNTER — Other Ambulatory Visit: Payer: Self-pay

## 2021-07-29 ENCOUNTER — Ambulatory Visit
Admission: RE | Admit: 2021-07-29 | Discharge: 2021-07-29 | Disposition: A | Discharge status: Home / Self Care | Payer: No Typology Code available for payment source | Source: Ambulatory Visit | Attending: Radiation Oncology | Admitting: Radiation Oncology

## 2021-07-29 DIAGNOSIS — C61 Malignant neoplasm of prostate: Secondary | ICD-10-CM | POA: Diagnosis not present

## 2021-07-29 LAB — RAD ONC ARIA SESSION SUMMARY
Course Elapsed Days: 19
Plan Fractions Treated to Date: 13
Plan Prescribed Dose Per Fraction: 2.5 Gy
Plan Total Fractions Prescribed: 28
Plan Total Prescribed Dose: 70 Gy
Reference Point Dosage Given to Date: 32.5 Gy
Reference Point Session Dosage Given: 2.5 Gy
Session Number: 13

## 2021-07-30 ENCOUNTER — Other Ambulatory Visit: Payer: Self-pay

## 2021-07-30 ENCOUNTER — Ambulatory Visit
Admission: RE | Admit: 2021-07-30 | Discharge: 2021-07-30 | Disposition: A | Discharge status: Home / Self Care | Payer: No Typology Code available for payment source | Source: Ambulatory Visit | Attending: Radiation Oncology | Admitting: Radiation Oncology

## 2021-07-30 DIAGNOSIS — C61 Malignant neoplasm of prostate: Secondary | ICD-10-CM | POA: Diagnosis not present

## 2021-07-30 LAB — RAD ONC ARIA SESSION SUMMARY
Course Elapsed Days: 20
Plan Fractions Treated to Date: 14
Plan Prescribed Dose Per Fraction: 2.5 Gy
Plan Total Fractions Prescribed: 28
Plan Total Prescribed Dose: 70 Gy
Reference Point Dosage Given to Date: 35 Gy
Reference Point Session Dosage Given: 2.5 Gy
Session Number: 14

## 2021-07-31 ENCOUNTER — Other Ambulatory Visit: Payer: Self-pay

## 2021-07-31 ENCOUNTER — Ambulatory Visit
Admission: RE | Admit: 2021-07-31 | Discharge: 2021-07-31 | Disposition: A | Discharge status: Home / Self Care | Payer: No Typology Code available for payment source | Source: Ambulatory Visit | Attending: Radiation Oncology | Admitting: Radiation Oncology

## 2021-07-31 DIAGNOSIS — C61 Malignant neoplasm of prostate: Secondary | ICD-10-CM | POA: Diagnosis not present

## 2021-07-31 LAB — RAD ONC ARIA SESSION SUMMARY
Course Elapsed Days: 21
Plan Fractions Treated to Date: 15
Plan Prescribed Dose Per Fraction: 2.5 Gy
Plan Total Fractions Prescribed: 28
Plan Total Prescribed Dose: 70 Gy
Reference Point Dosage Given to Date: 37.5 Gy
Reference Point Session Dosage Given: 2.5 Gy
Session Number: 15

## 2021-08-01 ENCOUNTER — Other Ambulatory Visit: Payer: Self-pay

## 2021-08-01 ENCOUNTER — Ambulatory Visit
Admission: RE | Admit: 2021-08-01 | Discharge: 2021-08-01 | Disposition: A | Discharge status: Home / Self Care | Payer: No Typology Code available for payment source | Source: Ambulatory Visit | Attending: Radiation Oncology | Admitting: Radiation Oncology

## 2021-08-01 DIAGNOSIS — C61 Malignant neoplasm of prostate: Secondary | ICD-10-CM | POA: Diagnosis not present

## 2021-08-01 LAB — RAD ONC ARIA SESSION SUMMARY
Course Elapsed Days: 22
Plan Fractions Treated to Date: 16
Plan Prescribed Dose Per Fraction: 2.5 Gy
Plan Total Fractions Prescribed: 28
Plan Total Prescribed Dose: 70 Gy
Reference Point Dosage Given to Date: 40 Gy
Reference Point Session Dosage Given: 2.5 Gy
Session Number: 16

## 2021-08-05 ENCOUNTER — Other Ambulatory Visit: Payer: Self-pay

## 2021-08-05 ENCOUNTER — Ambulatory Visit
Admission: RE | Admit: 2021-08-05 | Discharge: 2021-08-05 | Disposition: A | Discharge status: Home / Self Care | Payer: No Typology Code available for payment source | Source: Ambulatory Visit | Attending: Radiation Oncology | Admitting: Radiation Oncology

## 2021-08-05 DIAGNOSIS — C61 Malignant neoplasm of prostate: Secondary | ICD-10-CM | POA: Diagnosis not present

## 2021-08-05 LAB — RAD ONC ARIA SESSION SUMMARY
Course Elapsed Days: 26
Plan Fractions Treated to Date: 17
Plan Prescribed Dose Per Fraction: 2.5 Gy
Plan Total Fractions Prescribed: 28
Plan Total Prescribed Dose: 70 Gy
Reference Point Dosage Given to Date: 42.5 Gy
Reference Point Session Dosage Given: 2.5 Gy
Session Number: 17

## 2021-08-06 ENCOUNTER — Ambulatory Visit
Admission: RE | Admit: 2021-08-06 | Discharge: 2021-08-06 | Disposition: A | Discharge status: Home / Self Care | Payer: No Typology Code available for payment source | Source: Ambulatory Visit | Attending: Radiation Oncology | Admitting: Radiation Oncology

## 2021-08-06 ENCOUNTER — Other Ambulatory Visit: Payer: Self-pay

## 2021-08-06 DIAGNOSIS — C61 Malignant neoplasm of prostate: Secondary | ICD-10-CM | POA: Diagnosis not present

## 2021-08-06 LAB — RAD ONC ARIA SESSION SUMMARY
Course Elapsed Days: 27
Plan Fractions Treated to Date: 18
Plan Prescribed Dose Per Fraction: 2.5 Gy
Plan Total Fractions Prescribed: 28
Plan Total Prescribed Dose: 70 Gy
Reference Point Dosage Given to Date: 45 Gy
Reference Point Session Dosage Given: 2.5 Gy
Session Number: 18

## 2021-08-07 ENCOUNTER — Other Ambulatory Visit: Payer: Self-pay

## 2021-08-07 ENCOUNTER — Ambulatory Visit
Admission: RE | Admit: 2021-08-07 | Discharge: 2021-08-07 | Disposition: A | Discharge status: Home / Self Care | Payer: No Typology Code available for payment source | Source: Ambulatory Visit | Attending: Radiation Oncology | Admitting: Radiation Oncology

## 2021-08-07 DIAGNOSIS — C61 Malignant neoplasm of prostate: Secondary | ICD-10-CM | POA: Insufficient documentation

## 2021-08-07 LAB — RAD ONC ARIA SESSION SUMMARY
Course Elapsed Days: 28
Plan Fractions Treated to Date: 19
Plan Prescribed Dose Per Fraction: 2.5 Gy
Plan Total Fractions Prescribed: 28
Plan Total Prescribed Dose: 70 Gy
Reference Point Dosage Given to Date: 47.5 Gy
Reference Point Session Dosage Given: 2.5 Gy
Session Number: 19

## 2021-08-08 ENCOUNTER — Other Ambulatory Visit: Payer: Self-pay

## 2021-08-08 ENCOUNTER — Ambulatory Visit
Admission: RE | Admit: 2021-08-08 | Discharge: 2021-08-08 | Disposition: A | Discharge status: Home / Self Care | Payer: No Typology Code available for payment source | Source: Ambulatory Visit | Attending: Radiation Oncology | Admitting: Radiation Oncology

## 2021-08-08 DIAGNOSIS — C61 Malignant neoplasm of prostate: Secondary | ICD-10-CM | POA: Diagnosis not present

## 2021-08-08 LAB — RAD ONC ARIA SESSION SUMMARY
Course Elapsed Days: 29
Plan Fractions Treated to Date: 20
Plan Prescribed Dose Per Fraction: 2.5 Gy
Plan Total Fractions Prescribed: 28
Plan Total Prescribed Dose: 70 Gy
Reference Point Dosage Given to Date: 50 Gy
Reference Point Session Dosage Given: 2.5 Gy
Session Number: 20

## 2021-08-11 ENCOUNTER — Ambulatory Visit
Admission: RE | Admit: 2021-08-11 | Discharge: 2021-08-11 | Disposition: A | Discharge status: Home / Self Care | Payer: No Typology Code available for payment source | Source: Ambulatory Visit | Attending: Radiation Oncology | Admitting: Radiation Oncology

## 2021-08-11 ENCOUNTER — Other Ambulatory Visit: Payer: Self-pay

## 2021-08-11 DIAGNOSIS — C61 Malignant neoplasm of prostate: Secondary | ICD-10-CM | POA: Diagnosis not present

## 2021-08-11 LAB — RAD ONC ARIA SESSION SUMMARY
Course Elapsed Days: 32
Plan Fractions Treated to Date: 21
Plan Prescribed Dose Per Fraction: 2.5 Gy
Plan Total Fractions Prescribed: 28
Plan Total Prescribed Dose: 70 Gy
Reference Point Dosage Given to Date: 52.5 Gy
Reference Point Session Dosage Given: 0.1051 Gy
Session Number: 21

## 2021-08-12 ENCOUNTER — Ambulatory Visit
Admission: RE | Admit: 2021-08-12 | Discharge: 2021-08-12 | Disposition: A | Discharge status: Home / Self Care | Payer: No Typology Code available for payment source | Source: Ambulatory Visit | Attending: Radiation Oncology | Admitting: Radiation Oncology

## 2021-08-12 ENCOUNTER — Other Ambulatory Visit: Payer: Self-pay

## 2021-08-12 DIAGNOSIS — C61 Malignant neoplasm of prostate: Secondary | ICD-10-CM | POA: Diagnosis not present

## 2021-08-12 LAB — RAD ONC ARIA SESSION SUMMARY
Course Elapsed Days: 33
Plan Fractions Treated to Date: 22
Plan Prescribed Dose Per Fraction: 2.5 Gy
Plan Total Fractions Prescribed: 28
Plan Total Prescribed Dose: 70 Gy
Reference Point Dosage Given to Date: 55 Gy
Reference Point Session Dosage Given: 2.5 Gy
Session Number: 22

## 2021-08-13 ENCOUNTER — Ambulatory Visit: Payer: No Typology Code available for payment source

## 2021-08-14 ENCOUNTER — Other Ambulatory Visit: Payer: Self-pay

## 2021-08-14 ENCOUNTER — Ambulatory Visit
Admission: RE | Admit: 2021-08-14 | Discharge: 2021-08-14 | Disposition: A | Discharge status: Home / Self Care | Payer: No Typology Code available for payment source | Source: Ambulatory Visit | Attending: Radiation Oncology | Admitting: Radiation Oncology

## 2021-08-14 DIAGNOSIS — C61 Malignant neoplasm of prostate: Secondary | ICD-10-CM | POA: Diagnosis not present

## 2021-08-14 LAB — RAD ONC ARIA SESSION SUMMARY
Course Elapsed Days: 35
Plan Fractions Treated to Date: 23
Plan Prescribed Dose Per Fraction: 2.5 Gy
Plan Total Fractions Prescribed: 28
Plan Total Prescribed Dose: 70 Gy
Reference Point Dosage Given to Date: 57.5 Gy
Reference Point Session Dosage Given: 2.5 Gy
Session Number: 23

## 2021-08-15 ENCOUNTER — Ambulatory Visit
Admission: RE | Admit: 2021-08-15 | Discharge: 2021-08-15 | Disposition: A | Discharge status: Home / Self Care | Payer: No Typology Code available for payment source | Source: Ambulatory Visit | Attending: Radiation Oncology | Admitting: Radiation Oncology

## 2021-08-15 ENCOUNTER — Other Ambulatory Visit: Payer: Self-pay

## 2021-08-15 DIAGNOSIS — C61 Malignant neoplasm of prostate: Secondary | ICD-10-CM | POA: Diagnosis not present

## 2021-08-15 LAB — RAD ONC ARIA SESSION SUMMARY
Course Elapsed Days: 36
Plan Fractions Treated to Date: 24
Plan Prescribed Dose Per Fraction: 2.5 Gy
Plan Total Fractions Prescribed: 28
Plan Total Prescribed Dose: 70 Gy
Reference Point Dosage Given to Date: 60 Gy
Reference Point Session Dosage Given: 2.5 Gy
Session Number: 24

## 2021-08-18 ENCOUNTER — Other Ambulatory Visit: Payer: Self-pay

## 2021-08-18 ENCOUNTER — Ambulatory Visit
Admission: RE | Admit: 2021-08-18 | Discharge: 2021-08-18 | Disposition: A | Discharge status: Home / Self Care | Payer: No Typology Code available for payment source | Source: Ambulatory Visit | Attending: Radiation Oncology | Admitting: Radiation Oncology

## 2021-08-18 DIAGNOSIS — C61 Malignant neoplasm of prostate: Secondary | ICD-10-CM | POA: Diagnosis not present

## 2021-08-18 LAB — RAD ONC ARIA SESSION SUMMARY
Course Elapsed Days: 39
Plan Fractions Treated to Date: 25
Plan Prescribed Dose Per Fraction: 2.5 Gy
Plan Total Fractions Prescribed: 28
Plan Total Prescribed Dose: 70 Gy
Reference Point Dosage Given to Date: 62.5 Gy
Reference Point Session Dosage Given: 2.5 Gy
Session Number: 25

## 2021-08-19 ENCOUNTER — Ambulatory Visit
Admission: RE | Admit: 2021-08-19 | Discharge: 2021-08-19 | Disposition: A | Discharge status: Home / Self Care | Payer: No Typology Code available for payment source | Source: Ambulatory Visit | Attending: Radiation Oncology | Admitting: Radiation Oncology

## 2021-08-19 ENCOUNTER — Other Ambulatory Visit: Payer: Self-pay

## 2021-08-19 ENCOUNTER — Ambulatory Visit: Payer: No Typology Code available for payment source

## 2021-08-19 DIAGNOSIS — C61 Malignant neoplasm of prostate: Secondary | ICD-10-CM | POA: Diagnosis not present

## 2021-08-19 LAB — RAD ONC ARIA SESSION SUMMARY
Course Elapsed Days: 40
Plan Fractions Treated to Date: 26
Plan Prescribed Dose Per Fraction: 2.5 Gy
Plan Total Fractions Prescribed: 28
Plan Total Prescribed Dose: 70 Gy
Reference Point Dosage Given to Date: 65 Gy
Reference Point Session Dosage Given: 2.5 Gy
Session Number: 26

## 2021-08-20 ENCOUNTER — Ambulatory Visit
Admission: RE | Admit: 2021-08-20 | Discharge: 2021-08-20 | Disposition: A | Discharge status: Home / Self Care | Payer: No Typology Code available for payment source | Source: Ambulatory Visit | Attending: Radiation Oncology | Admitting: Radiation Oncology

## 2021-08-20 ENCOUNTER — Ambulatory Visit: Payer: No Typology Code available for payment source

## 2021-08-20 ENCOUNTER — Other Ambulatory Visit: Payer: Self-pay

## 2021-08-20 DIAGNOSIS — C61 Malignant neoplasm of prostate: Secondary | ICD-10-CM | POA: Diagnosis not present

## 2021-08-20 LAB — RAD ONC ARIA SESSION SUMMARY
Course Elapsed Days: 41
Plan Fractions Treated to Date: 27
Plan Prescribed Dose Per Fraction: 2.5 Gy
Plan Total Fractions Prescribed: 28
Plan Total Prescribed Dose: 70 Gy
Reference Point Dosage Given to Date: 67.5 Gy
Reference Point Session Dosage Given: 2.5 Gy
Session Number: 27

## 2021-08-21 ENCOUNTER — Encounter: Payer: Self-pay | Admitting: Urology

## 2021-08-21 ENCOUNTER — Ambulatory Visit
Admission: RE | Admit: 2021-08-21 | Discharge: 2021-08-21 | Disposition: A | Discharge status: Home / Self Care | Payer: No Typology Code available for payment source | Source: Ambulatory Visit | Attending: Radiation Oncology | Admitting: Radiation Oncology

## 2021-08-21 ENCOUNTER — Other Ambulatory Visit: Payer: Self-pay

## 2021-08-21 DIAGNOSIS — C61 Malignant neoplasm of prostate: Secondary | ICD-10-CM | POA: Diagnosis not present

## 2021-08-21 LAB — RAD ONC ARIA SESSION SUMMARY
Course Elapsed Days: 42
Plan Fractions Treated to Date: 28
Plan Prescribed Dose Per Fraction: 2.5 Gy
Plan Total Fractions Prescribed: 28
Plan Total Prescribed Dose: 70 Gy
Reference Point Dosage Given to Date: 70 Gy
Reference Point Session Dosage Given: 2.5 Gy
Session Number: 28

## 2021-10-07 ENCOUNTER — Telehealth: Payer: Self-pay

## 2021-10-07 ENCOUNTER — Encounter: Payer: Self-pay | Admitting: Urology

## 2021-10-07 NOTE — Telephone Encounter (Signed)
Left message reminder in reference to patient's 9:30am-09/08/21 telephone appointment w/ Ashlyn Bruning PA-C. I left my extension [phone] and requested that patient return my call, so that I may complete the nursing portion of this appointment.

## 2021-10-07 NOTE — Progress Notes (Signed)
Telephone appointment. I verified patient's identity and began nursing interview. No issues reported at this time.  Meaningful use complete. I-PSS score of 2-mild. Flomax as directed. Urology appt- Aug, 2023.  Reminded patient of their 9:30am-10/09/21 telephone appointment w/ Ashlyn Bruning PA-C. I left my extension [phone] in case patient needs anything. Patient verbalized understanding.  Patient contact [phone]

## 2021-10-09 ENCOUNTER — Ambulatory Visit
Admission: RE | Admit: 2021-10-09 | Discharge: 2021-10-09 | Disposition: A | Discharge status: Home / Self Care | Payer: Non-veteran care | Source: Ambulatory Visit | Attending: Urology | Admitting: Urology

## 2021-10-09 DIAGNOSIS — C61 Malignant neoplasm of prostate: Secondary | ICD-10-CM

## 2021-10-09 NOTE — Progress Notes (Signed)
Radiation Oncology         [phone] ________________________________  Name: George Harrell MRN: [id number]  Date: 10/09/2021  DOB: 25-Sep-1945  Post Treatment Note  CC: System, Provider Not In  Alphia Kava, MD  Diagnosis:   76 y.o. gentleman with Stage T1c adenocarcinoma of the prostate with Gleason score of 3+4, and PSA of 8.58.  Interval Since Last Radiation:  7 weeks  07/10/21 - 08/21/21:  The prostate was treated to 70 Gy in 28 fractions of 2.5 Gy  Narrative:  I spoke with the patient to conduct his routine scheduled 1 month follow up visit via telephone to spare the patient unnecessary potential exposure in the healthcare setting during the current COVID-19 pandemic.  The patient was notified in advance and gave permission to proceed with this visit format.  He tolerated radiation treatment relatively well with only minor urinary irritation and modest fatigue.  He did for increased nocturia, frequency and urgency as well as a few episodes of scant hematuria.  He specifically denied dysuria, straining to void or incontinence.  He also reported low-grade nausea and loose stools/diarrhea but denied abdominal pain or vomiting.                              On review of systems, the patient states that he is doing well in general.  His LUTS have completely resolved and he is back to his baseline at this point.  He has continued taking Flomax daily as prescribed.  He specifically denies dysuria, gross hematuria, excessive daytime frequency, urgency, straining to void, incomplete bladder emptying or incontinence.  He reports a healthy appetite and is maintaining his weight.  He denies abdominal pain, nausea, vomiting, diarrhea or constipation.  His energy level is gradually returning and overall, he is quite pleased with his progress to date.  ALLERGIES:  is allergic to simvastatin.  Meds: Current Outpatient Medications  Medication Sig Dispense Refill   amLODipine (NORVASC) 5 MG tablet  Take 5 mg by mouth daily.     aspirin 81 MG EC tablet 81 mg at bedtime.     buPROPion (WELLBUTRIN XL) 300 MG 24 hr tablet 300 mg daily.     carbamazepine (TEGRETOL) 200 MG tablet Take 1 tablet by mouth 4 (four) times daily.     Cholecalciferol 25 MCG (1000 UT) capsule Take 1 capsule by mouth daily.     Docusate Calcium (STOOL SOFTENER PO) Take 1 tablet by mouth daily as needed.     ferrous sulfate 325 (65 FE) MG tablet Take 1 tablet by mouth 3 (three) times a week.     gabapentin (NEURONTIN) 100 MG capsule 200 mg at bedtime.     hydrochlorothiazide (HYDRODIURIL) 25 MG tablet Take 0.5 tablets by mouth daily.     HYDROcodone-acetaminophen (NORCO/VICODIN) 5-325 MG tablet Take 1 tablet by mouth every 6 (six) hours as needed for moderate pain. (Patient not taking: Reported on 06/10/2021)     hydrOXYzine (ATARAX) 10 MG tablet Take 10 mg by mouth daily as needed.     insulin glargine-yfgn (SEMGLEE) 100 UNIT/ML injection 24 Units at bedtime.     lisinopril (ZESTRIL) 40 MG tablet Take 1 tablet by mouth daily.     methocarbamol (ROBAXIN) 500 MG tablet Take 1 tablet by mouth 3 (three) times daily as needed.     Omega-3 Fatty Acids (FISH OIL) 1000 MG CAPS Take 2 capsules by mouth 2 (two) times daily.  ondansetron (ZOFRAN) 8 MG tablet Take 8 mg by mouth 3 (three) times daily as needed for nausea or vomiting.     oxybutynin (DITROPAN) 5 MG tablet Take 5 mg by mouth 2 (two) times daily.     pantoprazole (PROTONIX) 40 MG tablet 40 mg 2 (two) times daily.     pravastatin (PRAVACHOL) 20 MG tablet Take 20 mg by mouth at bedtime.     sildenafil (VIAGRA) 100 MG tablet as needed.     tamsulosin (FLOMAX) 0.4 MG CAPS capsule Take 0.4 mg by mouth 2 (two) times daily.     terazosin (HYTRIN) 5 MG capsule Take 5 mg by mouth 2 (two) times daily.     venlafaxine XR (EFFEXOR-XR) 75 MG 24 hr capsule Take 3 capsules by mouth daily.     vitamin B-12 (CYANOCOBALAMIN) 500 MCG tablet Take by mouth daily.     No current  facility-administered medications for this encounter.    Physical Findings:  vitals were not taken for this visit.  Pain Assessment Pain Score: 0-No pain/10 Unable to assess due to telephone follow-up visit format.  Lab Findings: Lab Results  Component Value Date   WBC 9.6 05/23/2020   HGB 13.3 06/10/2021   HCT 39.0 06/10/2021   MCV 86.3 05/23/2020   PLT 279.0 05/23/2020     Radiographic Findings: No results found.  Impression/Plan: 1. 76 y.o. gentleman with Stage T1c adenocarcinoma of the prostate with Gleason score of 3+4, and PSA of 8.58. He will continue to follow up with urology for ongoing PSA determinations and has an appointment scheduled with his team at the [facility] later this month. He understands what to expect with regards to PSA monitoring going forward. I will look forward to following his response to treatment via correspondence with urology, and would be happy to continue to participate in his care if clinically indicated. I talked to the patient about what to expect in the future, including his risk for erectile dysfunction and rectal bleeding. I encouraged him to call or return to the office if he has any questions regarding his previous radiation or possible radiation side effects. He was comfortable with this plan and will follow up as needed.     Nicholos Johns, PA-C

## 2021-10-09 NOTE — Progress Notes (Signed)
RN successfully faxed over recent progress notes from 8/3 visit to [facility] [phone].

## 2021-10-09 NOTE — Progress Notes (Signed)
  Radiation Oncology         (336) [phone] ________________________________  Name: George Harrell MRN: [id number]  Date: 08/21/2021  DOB: [birth year]  End of Treatment Note  Diagnosis:   76 y.o. gentleman with Stage T1c adenocarcinoma of the prostate with Gleason score of 3+4, and PSA of 8.58.     Indication for treatment:  Curative, Definitive Radiotherapy       Radiation treatment dates:   07/10/21 - 08/21/21  Site/dose:   The prostate was treated to 70 Gy in 28 fractions of 2.5 Gy  Beams/energy:   The patient was treated with IMRT using volumetric arc therapy delivering 6 MV X-rays to clockwise and counterclockwise circumferential arcs with a 90 degree collimator offset to avoid dose scalloping.  Image guidance was performed with daily cone beam CT prior to each fraction to align to gold markers in the prostate and assure proper bladder and rectal fill volumes.  Immobilization was achieved with [employer] custom mold.  Narrative: The patient tolerated radiation treatment relatively well with only minor urinary irritation and modest fatigue.  He did for increased nocturia, frequency and urgency as well as a few episodes of scant hematuria.  He specifically denied dysuria, straining to void or incontinence.  He also reported low-grade nausea and loose stools/diarrhea but denied abdominal pain or vomiting.  Plan: The patient has completed radiation treatment. He will return to radiation oncology clinic for routine followup in one month. I advised him to call or return sooner if he has any questions or concerns related to his recovery or treatment. ________________________________  Sheral Apley. Tammi Klippel, M.D.

## 2021-11-06 NOTE — Progress Notes (Signed)
Pt was recently seen @ [facility] on 8/28 for additional follow up's post radiation treatment.   RN left voicemail with patient to assess any additional needs.

## 2021-11-11 ENCOUNTER — Encounter: Payer: Self-pay | Admitting: *Deleted

## 2021-11-27 ENCOUNTER — Inpatient Hospital Stay: Payer: Non-veteran care | Attending: Adult Health | Admitting: *Deleted

## 2021-11-27 DIAGNOSIS — C61 Malignant neoplasm of prostate: Secondary | ICD-10-CM

## 2021-11-28 ENCOUNTER — Encounter: Payer: Self-pay | Admitting: *Deleted

## 2021-11-28 NOTE — Progress Notes (Signed)
SCP reviewed and completed. 

## 2021-12-30 IMAGING — MR MR PELVIS WO/W CM
4 of 8 series · 22 of 48 positions shown · IV contrast (gadavist)
Comparison: None.

CLINICAL DATA: Pelvic pain

EXAM:
MRI PELVIS WITHOUT AND WITH CONTRAST
TECHNIQUE: Multiplanar multisequence MR imaging of the pelvis was performed
both before and after administration of intravenous contrast.
CONTRAST:  7mL GADAVIST GADOBUTROL 1 MMOL/ML IV SOLN

[Series 3: T2 · coronal · 5.0mm · 1.56mm/px · 5 of 38 slices shown (1 of 3)]
[im 1/38]
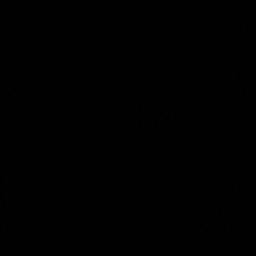
[im 10/38]
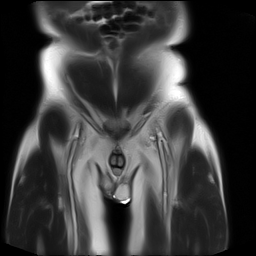
[im 19/38]
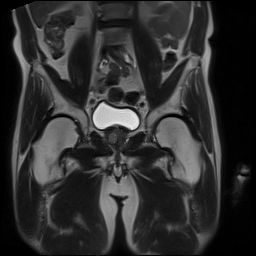
[im 28/38]
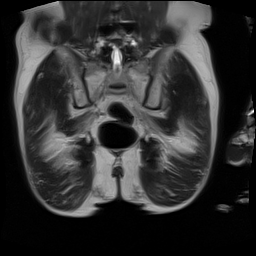
[im 38/38]
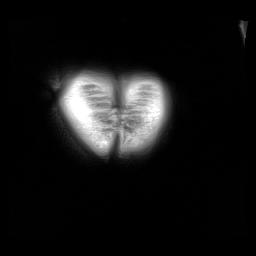

[Series 4: T2 · axial · 5.0mm · 0.51mm/px · z∈[-99,+99]mm · 6 of 34 slices shown (2 of 3)]
[im 1/34]
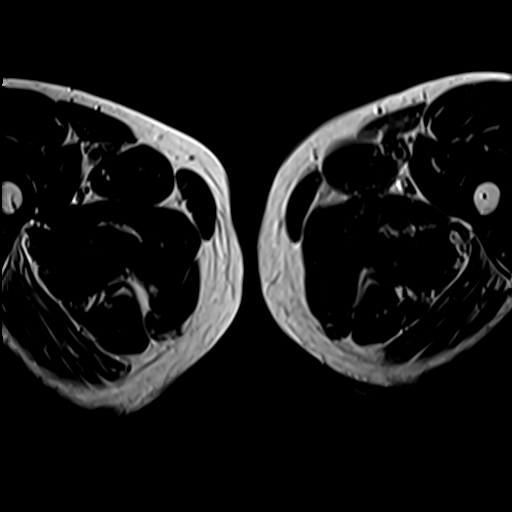
[im 7/34]
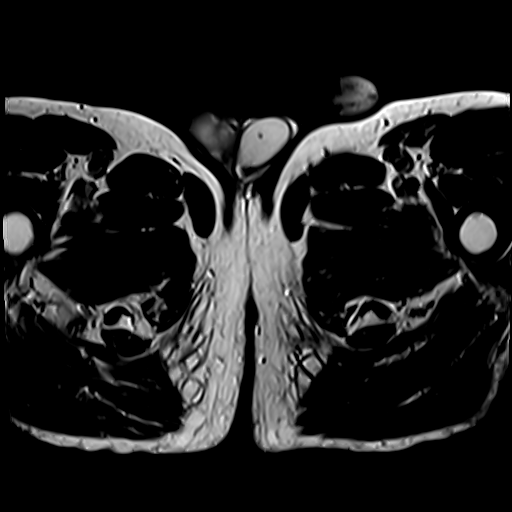
[im 14/34]
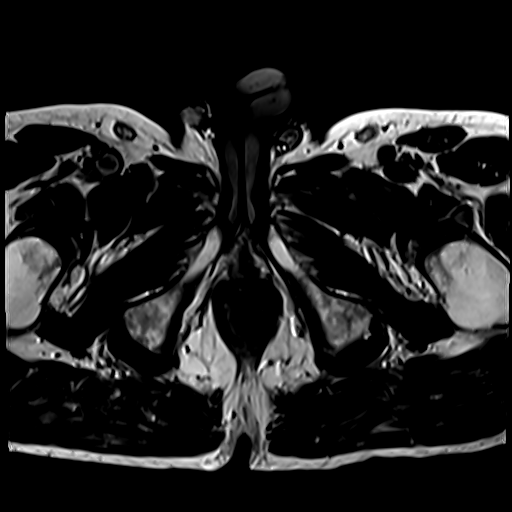
[im 20/34]
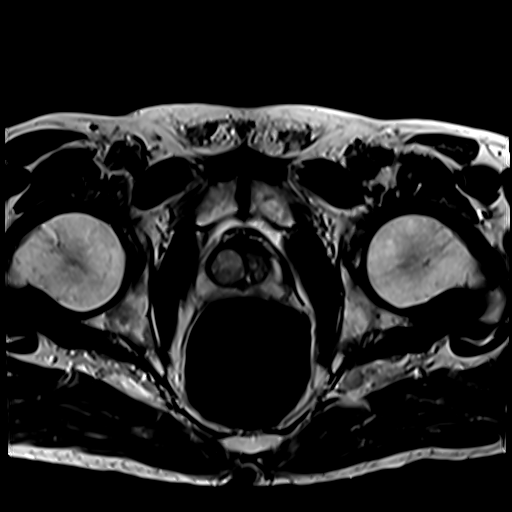
[im 27/34]
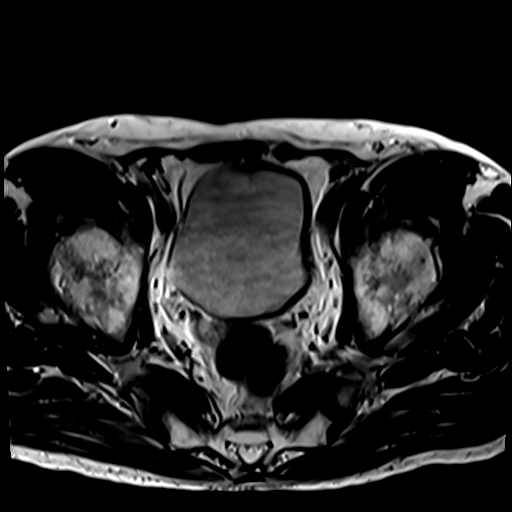
[im 34/34]
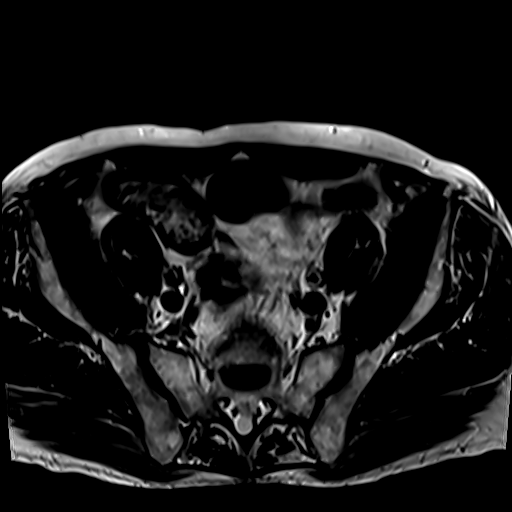

[Series 5: T2 fat-sat · axial · 5.0mm · 0.51mm/px · z∈[-99,+99]mm · 6 of 34 slices shown]
[im 1/34]
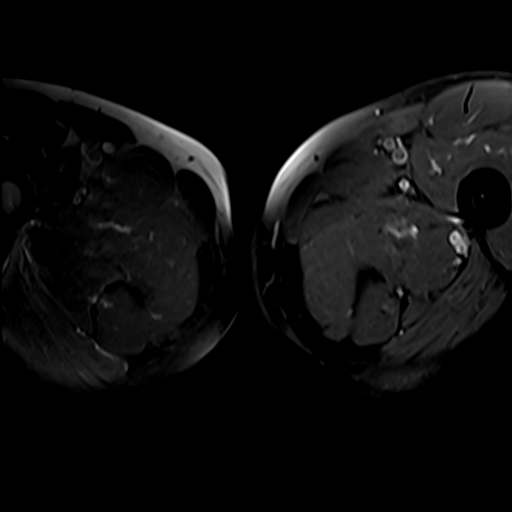
[im 7/34]
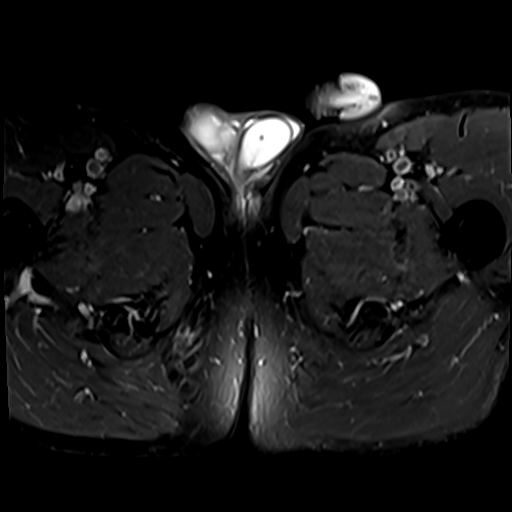
[im 14/34]
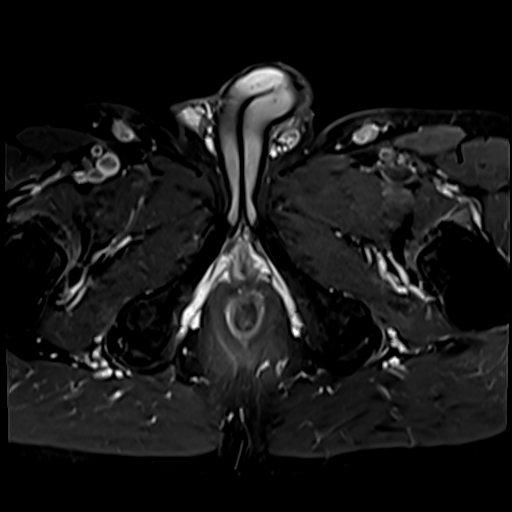
[im 20/34]
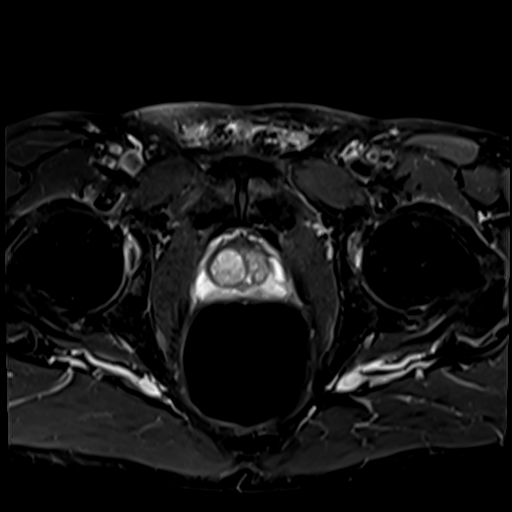
[im 27/34]
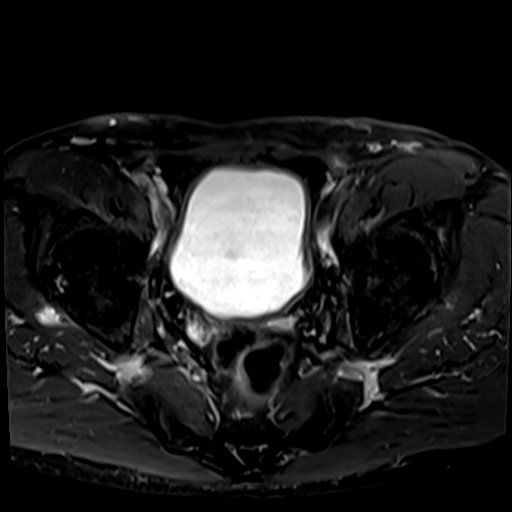
[im 34/34]
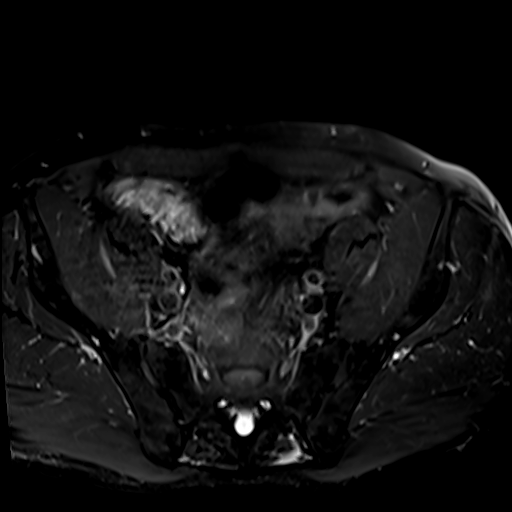

[Series 6: T2 · sagittal · 5.0mm · 0.55mm/px · 5 of 40 slices shown (3 of 3)]
[im 1/40]
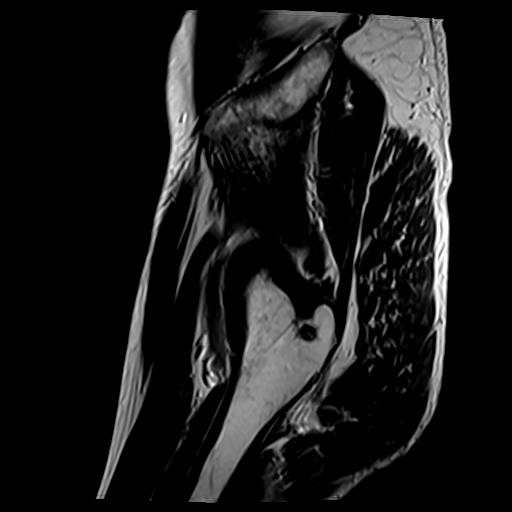
[im 7/40]
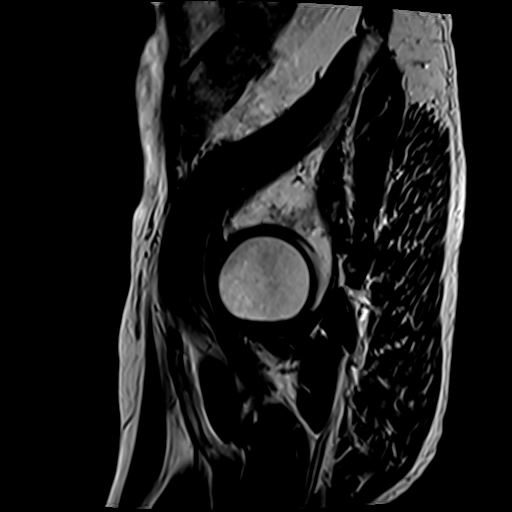
[im 14/40]
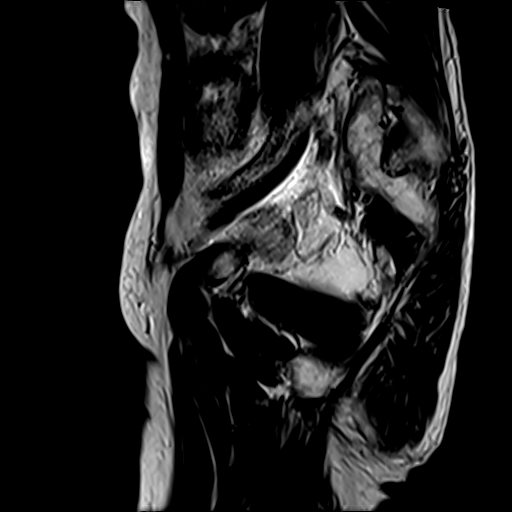
[im 20/40]
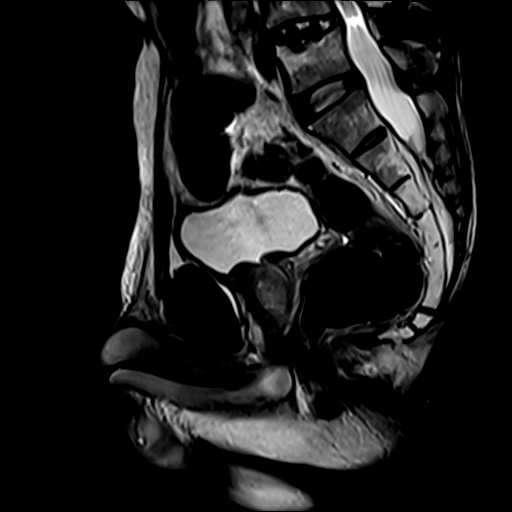
[im 33/40]
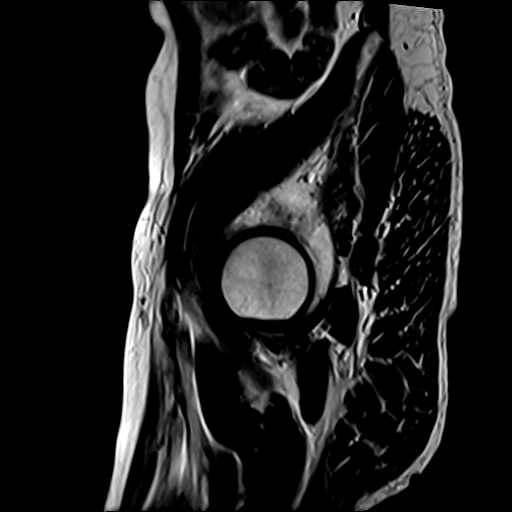

[22 of 48 positions shown; findings below may reference images not displayed]

FINDINGS: Urinary Tract:  No abnormality visualized.

Bowel:  Unremarkable visualized pelvic bowel loops.

Vascular/Lymphatic: No pathologically enlarged lymph nodes. No
significant vascular abnormality seen.

Reproductive:  Nodularity within the central zone of prostate gland.

Other:  No free fluid within the pelvis.

Musculoskeletal: No acute fracture or dislocation. No femoral head
avascular necrosis. Mild degenerative changes of the bilateral hips.
Degenerative subchondral cystic changes within the superior right
acetabulum. Multilobulated cyst adjacent to the rim of the
posterosuperior acetabulum measuring 2.1 x 1.2 x 2.1 cm suggestive
of a paralabral cyst (series 3, image 24). Smaller cyst along the
posterosuperior margin of the left acetabulum measuring up to 1.2 cm
(series 3, image 23) also likely paralabral cyst. No hip joint
effusion. Prior lumbar fusion.

The visualized tendinous structures about the pelvis appear intact.
Mild tendinosis of the bilateral hamstring tendon origins. Normal
muscle bulk and signal intensity without edema, atrophy, or fatty
infiltration. No fluid collections.
IMPRESSION: 1. Mild degenerative changes of the bilateral hips. Cystic
structures along the posterosuperior acetabular rims, right larger
than left, suggestive of paralabral cysts relating to underlying
labral tears.
2. Mild tendinosis of the bilateral hamstring tendon origins.
3. Nodularity within the central zone of the prostate gland, likely
reflecting BPH. Correlate with serum PSA.

## 2021-12-30 IMAGING — MR MR ABDOMEN WO/W CM
19 series · 48 of 48 positions shown · IV contrast (7ml GADAVIST)
Comparison: None.

CLINICAL DATA: Epigastric abdominal pain, chronic for 2 years with
recent worsening. Reported history of recent CT study with probable
early pancreatitis.

EXAM:
MRI ABDOMEN WITHOUT AND WITH CONTRAST
TECHNIQUE: Multiplanar multisequence MR imaging of the abdomen was performed
both before and after the administration of intravenous contrast.
CONTRAST:  7mL GADAVIST GADOBUTROL 1 MMOL/ML IV SOLN

[Series 10: T2 fat-sat · axial · 6.0mm · 1.25mm/px · z∈[+118,+370]mm · 2 of 36 slices shown (1 of 2)]
[im 1/36]
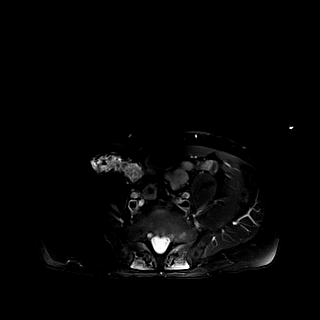
[im 36/36]
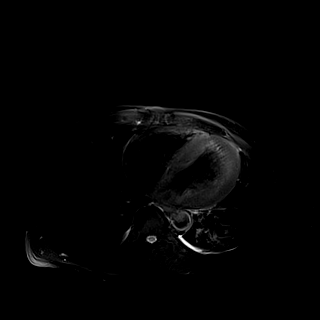

[Series 12: DWI · axial · 6.0mm · 1.49mm/px · z∈[+116,+368]mm · 3 of 72 slices shown (1 of 2)]
[im 1/72]
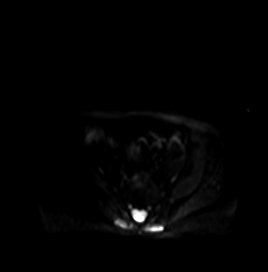
[im 36/72]
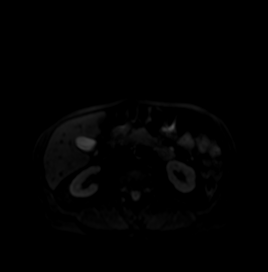
[im 72/72]
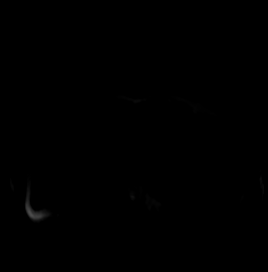

[Series 13: DWI · axial · 6.0mm · 1.49mm/px · 1 of 36 slices shown (2 of 2)]
[im 1/36]
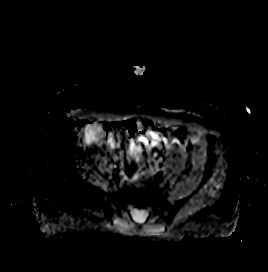

[Series 14: T1 · axial · 3.0mm · 1.25mm/px · z∈[+156,+369]mm · 3 of 72 slices shown (1 of 2)]
[im 1/72]
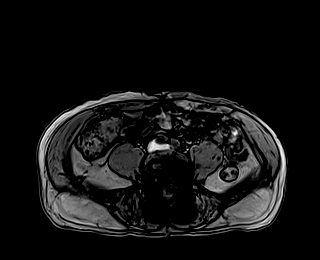
[im 36/72]
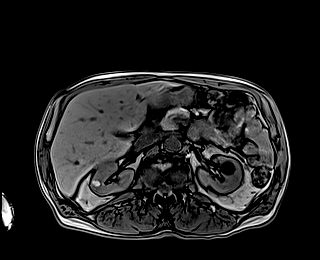
[im 72/72]
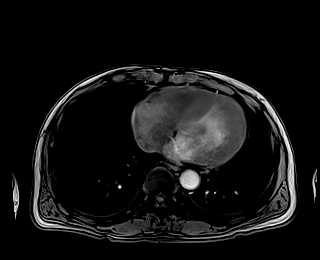

[Series 15: T1 · axial · 3.0mm · 1.25mm/px · z∈[+156,+369]mm · 3 of 72 slices shown (2 of 2)]
[im 1/72]
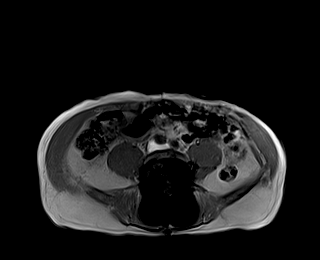
[im 36/72]
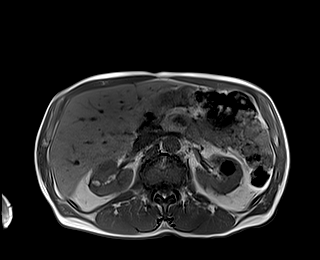
[im 72/72]
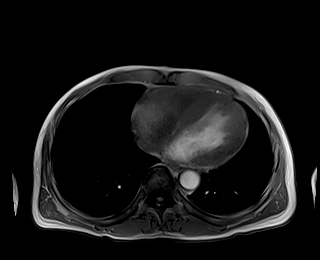

[Series 16: bSSFP · axial · 4.0mm · 0.84mm/px · z∈[+136,+372]mm · 2 of 60 slices shown (1 of 2)]
[im 1/60]
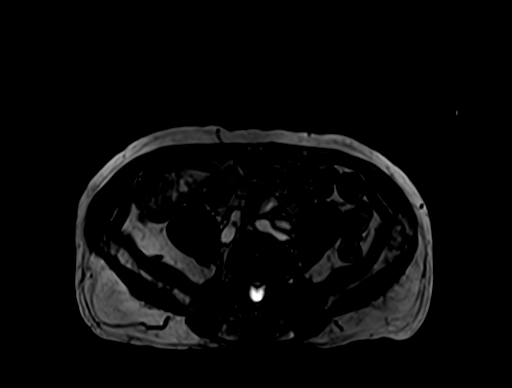
[im 60/60]
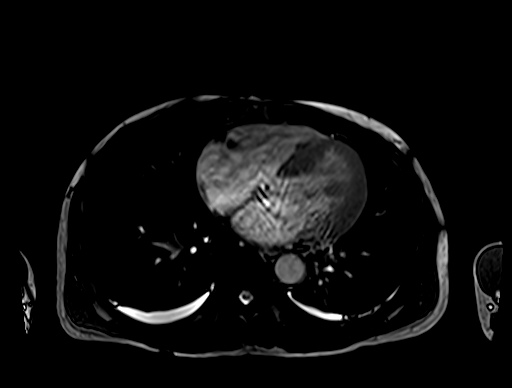

[Series 17: bSSFP · axial · 4.0mm · 0.84mm/px · z∈[+136,+372]mm · 2 of 60 slices shown (2 of 2)]
[im 1/60]
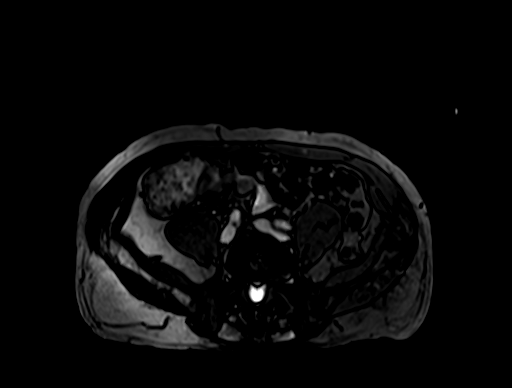
[im 60/60]
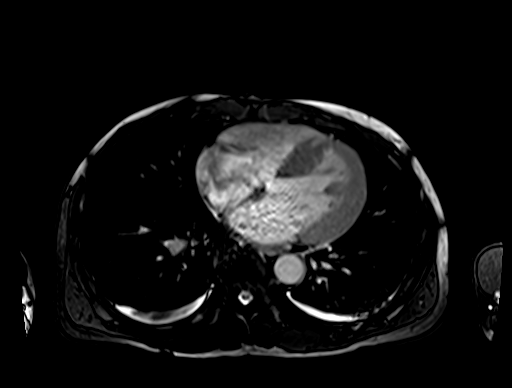

[Series 18: T2 · coronal · 6.0mm · 1.56mm/px · 1 of 30 slices shown]
[im 1/30]
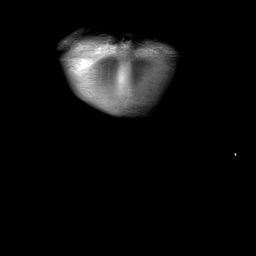

[Series 19: T2 fat-sat · axial · 6.0mm · 1.56mm/px · z∈[+91,+386]mm · 2 of 42 slices shown (2 of 2)]
[im 1/42]
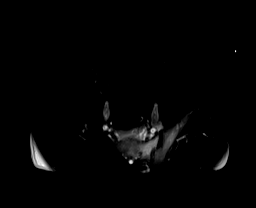
[im 42/42]
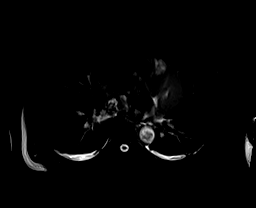

[Series 21: T1 dynamic · axial · 3.0mm · 1.25mm/px · z∈[+139,+376]mm · 3 of 80 slices shown (1 of 10)]
[im 1/80]
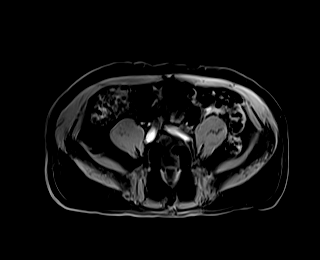
[im 40/80]
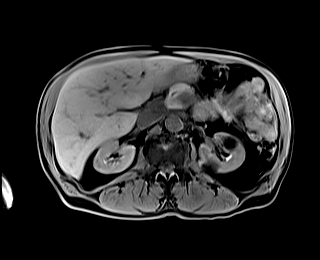
[im 80/80]
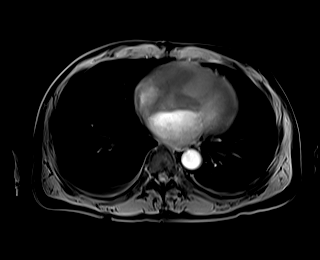

[Series 25: T1 dynamic · axial · 3.0mm · 1.25mm/px · z∈[+139,+376]mm · 3 of 80 slices shown (2 of 10)]
[im 1/80]
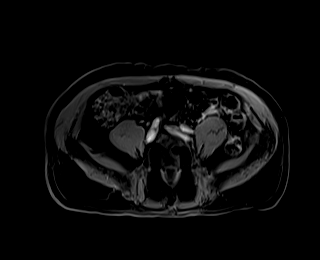
[im 40/80]
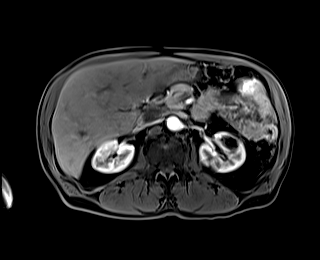
[im 80/80]
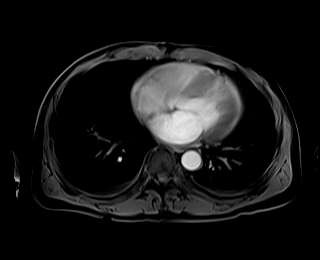

[Series 26: T1 dynamic · axial · 3.0mm · 1.25mm/px · z∈[+139,+376]mm · 3 of 80 slices shown (3 of 10)]
[im 1/80]
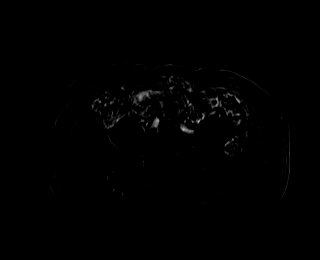
[im 40/80]
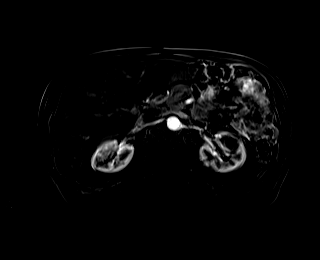
[im 80/80]
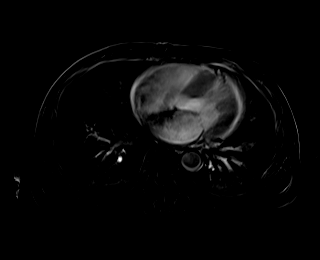

[Series 29: T1 dynamic · axial · 3.0mm · 1.25mm/px · z∈[+139,+376]mm · 3 of 80 slices shown (4 of 10)]
[im 1/80]
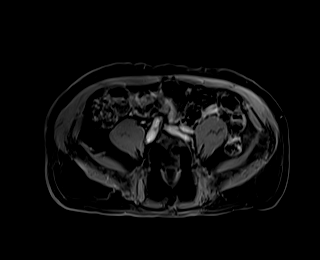
[im 40/80]
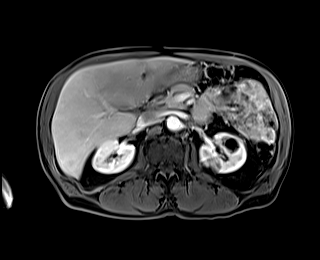
[im 80/80]
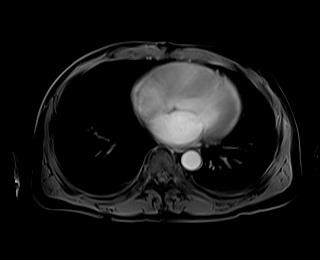

[Series 30: T1 dynamic · axial · 3.0mm · 1.25mm/px · z∈[+139,+376]mm · 3 of 80 slices shown (5 of 10)]
[im 1/80]
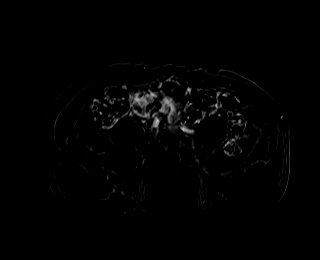
[im 40/80]
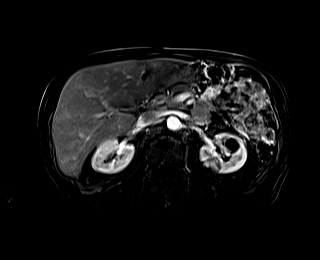
[im 80/80]
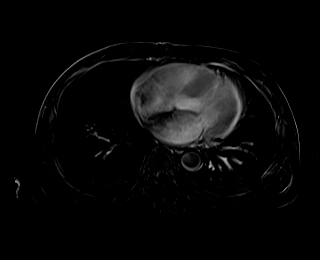

[Series 33: T1 dynamic · axial · 3.0mm · 1.25mm/px · z∈[+139,+376]mm · 3 of 80 slices shown (6 of 10)]
[im 1/80]
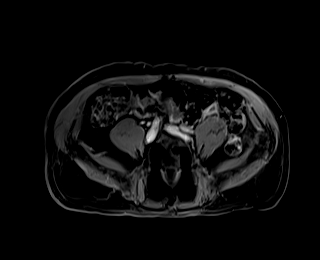
[im 40/80]
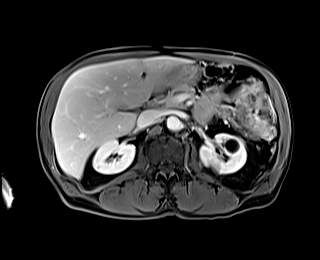
[im 80/80]
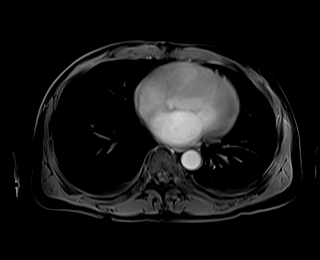

[Series 34: T1 dynamic · axial · 3.0mm · 1.25mm/px · z∈[+139,+376]mm · 3 of 80 slices shown (7 of 10)]
[im 1/80]
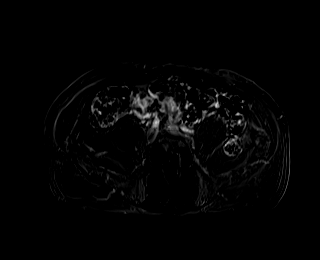
[im 40/80]
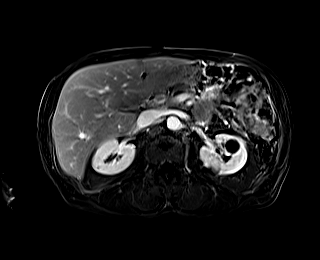
[im 80/80]
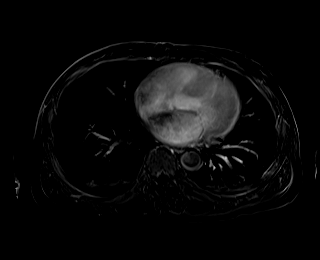

[Series 37: T1 dynamic · coronal · 3.0mm · 1.41mm/px · 2 of 60 slices shown (8 of 10)]
[im 1/60]
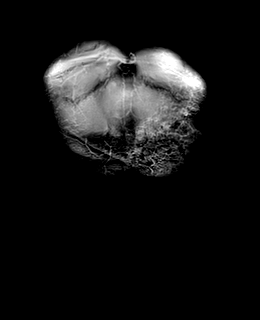
[im 60/60]
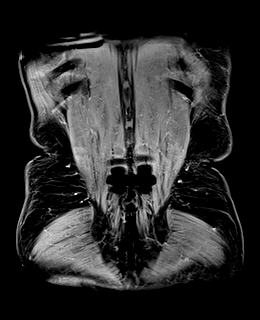

[Series 41: T1 dynamic · axial · 3.0mm · 1.25mm/px · z∈[+139,+376]mm · 3 of 80 slices shown (9 of 10)]
[im 1/80]
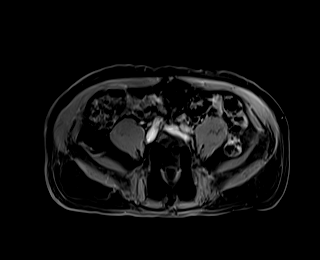
[im 40/80]
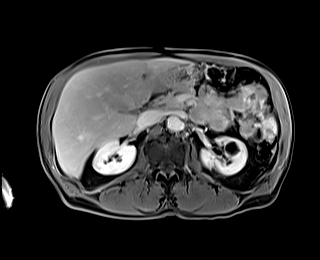
[im 80/80]
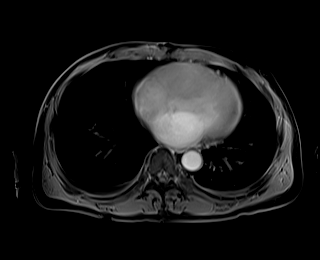

[Series 42: T1 dynamic · axial · 3.0mm · 1.25mm/px · z∈[+139,+376]mm · 3 of 80 slices shown (10 of 10)]
[im 1/80]
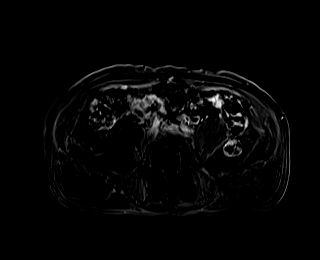
[im 40/80]
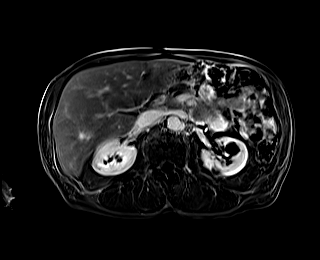
[im 80/80]
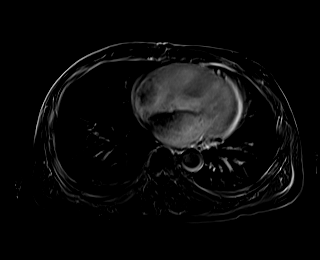

[48 of 48 positions shown; findings below may reference images not displayed]

FINDINGS: Lower chest: Trace dependent bilateral pleural effusions.

Hepatobiliary: Normal liver size and configuration. No hepatic
steatosis. No liver mass. Normal gallbladder with no cholelithiasis.
No biliary ductal dilatation. Common bile duct diameter 3 mm. No
choledocholithiasis. No biliary masses, strictures or beading.

Pancreas: No pancreatic mass or duct dilation. Normal pancreatic
parenchymal signal intensity, thickness and enhancement. No pancreas
divisum.

Spleen: Normal size. No mass.

Adrenals/Urinary Tract: Normal adrenals. No hydronephrosis. T1
hyperintense 1.0 x 0.8 cm renal cortical lesion in the lateral
interpolar right kidney (series 21/image 44) without appreciable
enhancement on the subtraction sequences, compatible with
hemorrhagic/proteinaceous Bosniak category 2 renal cyst. Numerous
simple bilateral renal cysts, largest 2.8 cm in the medial upper
left kidney. No suspicious renal masses.

Stomach/Bowel: Normal non-distended stomach. Visualized small and
large bowel is normal caliber, with no bowel wall thickening.

Vascular/Lymphatic: Atherosclerotic nonaneurysmal abdominal aorta.
Patent portal, splenic, hepatic and renal veins. No pathologically
enlarged lymph nodes in the abdomen.

Other: No abdominal ascites or focal fluid collection.

Musculoskeletal: No aggressive appearing focal osseous lesions.
Bilateral posterior spinal fusion hardware in the lower lumbar
spine. Chronic L1 vertebral compression fracture as detailed on
prior lumbar spine MRI.
IMPRESSION: 1. No acute abnormality. Normal pancreas with no pancreatic mass or
duct dilation. No evidence of acute or chronic pancreatitis.
2. Normal gallbladder with no cholelithiasis. No biliary ductal
dilatation. No choledocholithiasis.
3. Trace dependent bilateral pleural effusions.
4. Bosniak category 1 and category 2 renal cysts. No suspicious
renal masses.

## 2023-06-02 ENCOUNTER — Other Ambulatory Visit: Payer: Self-pay

## 2023-06-02 ENCOUNTER — Emergency Department (HOSPITAL_COMMUNITY)

## 2023-06-02 ENCOUNTER — Emergency Department (HOSPITAL_COMMUNITY)
Admission: EM | Admit: 2023-06-02 | Discharge: 2023-06-02 | Disposition: A | Discharge status: Home / Self Care | Attending: Emergency Medicine | Admitting: Emergency Medicine

## 2023-06-02 DIAGNOSIS — Z7982 Long term (current) use of aspirin: Secondary | ICD-10-CM | POA: Diagnosis not present

## 2023-06-02 DIAGNOSIS — K625 Hemorrhage of anus and rectum: Secondary | ICD-10-CM | POA: Insufficient documentation

## 2023-06-02 DIAGNOSIS — Z87891 Personal history of nicotine dependence: Secondary | ICD-10-CM | POA: Insufficient documentation

## 2023-06-02 DIAGNOSIS — Z794 Long term (current) use of insulin: Secondary | ICD-10-CM | POA: Insufficient documentation

## 2023-06-02 DIAGNOSIS — Z7902 Long term (current) use of antithrombotics/antiplatelets: Secondary | ICD-10-CM | POA: Diagnosis not present

## 2023-06-02 DIAGNOSIS — Z8546 Personal history of malignant neoplasm of prostate: Secondary | ICD-10-CM | POA: Insufficient documentation

## 2023-06-02 DIAGNOSIS — R1013 Epigastric pain: Secondary | ICD-10-CM | POA: Diagnosis present

## 2023-06-02 LAB — COMPREHENSIVE METABOLIC PANEL
ALT: 58 U/L — ABNORMAL HIGH (ref 0–44)
AST: 70 U/L — ABNORMAL HIGH (ref 15–41)
Albumin: 3.8 g/dL (ref 3.5–5.0)
Alkaline Phosphatase: 179 U/L — ABNORMAL HIGH (ref 38–126)
Anion gap: 12 (ref 5–15)
BUN: 29 mg/dL — ABNORMAL HIGH (ref 8–23)
CO2: 21 mmol/L — ABNORMAL LOW (ref 22–32)
Calcium: 9.1 mg/dL (ref 8.9–10.3)
Chloride: 102 mmol/L (ref 98–111)
Creatinine, Ser: 1.6 mg/dL — ABNORMAL HIGH (ref 0.61–1.24)
GFR, Estimated: 44 mL/min — ABNORMAL LOW (ref >60)
Glucose, Bld: 239 mg/dL — ABNORMAL HIGH (ref 70–99)
Potassium: 4.4 mmol/L (ref 3.5–5.1)
Sodium: 135 mmol/L (ref 135–145)
Total Bilirubin: 1.1 mg/dL (ref 0.0–1.2)
Total Protein: 7.1 g/dL (ref 6.5–8.1)

## 2023-06-02 LAB — TYPE AND SCREEN
ABO/RH(D): B POS
Antibody Screen: NEGATIVE

## 2023-06-02 LAB — CBC
HCT: 43.4 % (ref 39.0–52.0)
Hemoglobin: 13.7 g/dL (ref 13.0–17.0)
MCH: 29.2 pg (ref 26.0–34.0)
MCHC: 31.6 g/dL (ref 30.0–36.0)
MCV: 92.5 fL (ref 80.0–100.0)
Platelets: 331 10*3/uL (ref 150–400)
RBC: 4.69 MIL/uL (ref 4.22–5.81)
RDW: 15.1 % (ref 11.5–15.5)
WBC: 10 10*3/uL (ref 4.0–10.5)
nRBC: 0 % (ref 0.0–0.2)

## 2023-06-02 LAB — POC OCCULT BLOOD, ED: Fecal Occult Bld: NEGATIVE

## 2023-06-02 MED ORDER — IOHEXOL 350 MG/ML SOLN
80.0000 mL | Freq: Once | INTRAVENOUS | Status: AC | PRN
  Administered 2023-06-02: 75 mL via INTRAVENOUS

## 2023-06-02 NOTE — Discharge Instructions (Signed)
 Your rectal bleeding is likely due to bleeding hemorrhoid.  Take MiraLAX to help soften your stool to decrease risk of constipation which can aggravate your symptoms.  Your CT scan today did not show any concerning finding.  Please follow-up closely with your GI specialist for outpatient care.  Return if you have any concern.

## 2023-06-02 NOTE — ED Provider Notes (Signed)
 [facility] AT [facility] Provider Note   CSN: [id number] Arrival date & time: 06/02/23  1109     History  Chief Complaint  Patient presents with   Abdominal Pain   Rectal Bleeding    George Harrell is a 78 y.o. male.  The history is provided by the patient and medical records. No language interpreter was used.  Abdominal Pain Associated symptoms: hematochezia   Rectal Bleeding Associated symptoms: abdominal pain      78 year old male with PMH significant for adenomatous polyp of the colon and malignant neoplasm of the prostate presents for blood in the stool for four days and associated abdominal pain. He reports having stomach issues for over two years which resulted in an inconclusive workup by the [place]. In the past four days he has experienced frank blood each time he uses the restroom, pain with defecation and wiping, as well as nausea/dry heaving.  He notes significantly reduced appetite secondary to stomach discomfort but has been able to keep down liquids and some soup. Denies any aggravating or alleviating factors. Denies fevers, hematuria, dysuria, incontinence, hematemesis.  Patient also report he takes Plavix for stents placed in his left lower extremity due to peripheral vascular disease.  Denies alcohol use, and does use marijuana on occasion.  Home Medications Prior to Admission medications   Medication Sig Start Date End Date Taking? Authorizing Provider  amLODipine (NORVASC) 5 MG tablet Take 5 mg by mouth daily.    [provider]  aspirin 81 MG EC tablet 81 mg at bedtime. 03/18/21   [provider]  buPROPion (WELLBUTRIN XL) 300 MG 24 hr tablet 300 mg daily. 06/19/19   [provider]  carbamazepine (TEGRETOL) 200 MG tablet Take 1 tablet by mouth 4 (four) times daily. 01/10/19   [provider]  Cholecalciferol 25 MCG (1000 UT) capsule Take 1 capsule by mouth daily.    [provider]   Docusate Calcium (STOOL SOFTENER PO) Take 1 tablet by mouth daily as needed.    [provider]  feeding supplement, GLUCERNA SHAKE, (GLUCERNA SHAKE) LIQD Take 237 mLs by mouth 3 (three) times daily between meals.    [provider]  ferrous sulfate 325 (65 FE) MG tablet Take 1 tablet by mouth 3 (three) times a week.    [provider]  gabapentin (NEURONTIN) 100 MG capsule 200 mg at bedtime. 01/24/21   [provider]  hydrochlorothiazide (HYDRODIURIL) 25 MG tablet Take 0.5 tablets by mouth daily.    [provider]  HYDROcodone-acetaminophen (NORCO/VICODIN) 5-325 MG tablet Take 1 tablet by mouth every 6 (six) hours as needed for moderate pain. Patient not taking: Reported on 06/10/2021    [provider]  hydrOXYzine (ATARAX) 10 MG tablet Take 10 mg by mouth daily as needed. Patient not taking: Reported on 11/27/2021 01/10/19   [provider]  insulin glargine-yfgn (SEMGLEE) 100 UNIT/ML injection 24 Units at bedtime. 03/18/21   [provider]  lisinopril (ZESTRIL) 40 MG tablet Take 1 tablet by mouth daily. 08/17/19   [provider]  methocarbamol (ROBAXIN) 500 MG tablet Take 1 tablet by mouth 3 (three) times daily as needed. 09/19/20   [provider]  Omega-3 Fatty Acids (FISH OIL) 1000 MG CAPS Take 2 capsules by mouth 2 (two) times daily.    [provider]  ondansetron (ZOFRAN) 8 MG tablet Take 8 mg by mouth 3 (three) times daily as needed for nausea or  vomiting.    [provider]  oxybutynin (DITROPAN) 5 MG tablet Take 5 mg by mouth 2 (two) times daily.    [provider]  pantoprazole (PROTONIX) 40 MG tablet 40 mg 2 (two) times daily. 10/25/18   [provider]  pravastatin (PRAVACHOL) 20 MG tablet Take 20 mg by mouth at bedtime. Patient not taking: Reported on 11/27/2021    [provider]  sildenafil (VIAGRA) 100 MG tablet as needed. Patient not taking:  Reported on 11/27/2021    [provider]  tamsulosin (FLOMAX) 0.4 MG CAPS capsule Take 0.4 mg by mouth 2 (two) times daily.    [provider]  terazosin (HYTRIN) 5 MG capsule Take 5 mg by mouth 2 (two) times daily.    [provider]  UNABLE TO FIND Take by mouth 3 (three) times daily.    [provider]  venlafaxine XR (EFFEXOR-XR) 75 MG 24 hr capsule Take 3 capsules by mouth daily. 01/10/19   [provider]  vitamin B-12 (CYANOCOBALAMIN) 500 MCG tablet Take by mouth daily. 08/30/18   [provider]      Allergies    Simvastatin    Review of Systems   Review of Systems  Gastrointestinal:  Positive for abdominal pain and hematochezia.  All other systems reviewed and are negative.   Physical Exam Updated Vital Signs BP (!) 144/85 (BP Location: Left Arm)   Pulse 72   Temp 98 F (36.7 C) (Oral)   Resp 18   SpO2 95%  Physical Exam Constitutional:      General: He is not in acute distress.    Appearance: He is well-developed.  HENT:     Head: Atraumatic.  Eyes:     Conjunctiva/sclera: Conjunctivae normal.  Cardiovascular:     Rate and Rhythm: Normal rate and regular rhythm.     Pulses: Normal pulses.     Heart sounds: Normal heart sounds.  Pulmonary:     Effort: Pulmonary effort is normal.     Breath sounds: Normal breath sounds.  Abdominal:     Palpations: Abdomen is soft.     Tenderness: There is abdominal tenderness (Epigastric tenderness no guarding no rebound tenderness bowel sounds present).  Genitourinary:    Comments: Chaperone present during exam.  No thrombosed hemorrhoid, normal rectal tone no obvious mass, normal color stool on glove no stool impaction.  Patient does exhibit discomfort with digital rectal exam. Musculoskeletal:     Cervical back: Normal range of motion and neck supple.  Skin:    Findings: No rash.  Neurological:     Mental Status: He is alert.     ED Results / Procedures / Treatments    Labs (all labs ordered are listed, but only abnormal results are displayed) Labs Reviewed  COMPREHENSIVE METABOLIC PANEL - Abnormal; Notable for the following components:      Result Value   CO2 21 (*)    Glucose, Bld 239 (*)    BUN 29 (*)    Creatinine, Ser 1.60 (*)    AST 70 (*)    ALT 58 (*)    Alkaline Phosphatase 179 (*)    GFR, Estimated 44 (*)    All other components within normal limits  CBC  POC OCCULT BLOOD, ED  TYPE AND SCREEN  ABO/RH    EKG None  Radiology CT ANGIO GI BLEED Result Date: 06/02/2023 CLINICAL DATA:  LUQ abdominal pain. Bloody stool, abd pain, nv x3 days. Describes as "clotty" "  EXAM: CTA ABDOMEN AND PELVIS WITHOUT AND WITH CONTRAST TECHNIQUE: Multidetector CT imaging of the abdomen and pelvis was performed using the standard protocol during bolus administration of intravenous contrast. Multiplanar reconstructed images and MIPs were obtained and reviewed to evaluate the vascular anatomy. RADIATION DOSE REDUCTION: This exam was performed according to the departmental dose-optimization program which includes automated exposure control, adjustment of the mA and/or kV according to patient size and/or use of iterative reconstruction technique. CONTRAST:  75mL OMNIPAQUE IOHEXOL 350 MG/ML SOLN COMPARISON:  MRI abdomen pelvis 06/06/2020 FINDINGS: VASCULAR No extravasation of intravenous contrast into the bowel lumen. Aorta: Severe atherosclerotic plaque. Normal caliber aorta without aneurysm, dissection, vasculitis or significant stenosis. Celiac: Patent without evidence of aneurysm, dissection, vasculitis or significant stenosis. SMA: Patent without evidence of aneurysm, dissection, vasculitis or significant stenosis. Renals: Both renal arteries are patent without evidence of aneurysm, dissection, vasculitis, fibromuscular dysplasia or significant stenosis. IMA: Patent without evidence of aneurysm, dissection, vasculitis or significant stenosis. Inflow: Moderate  atherosclerotic plaque. 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 External and common iliacs are patent without evidence of aneurysm, dissection, vasculitis or significant stenosis. Proximal Outflow: Bilateral common femoral and visualized portions of the superficial and profunda femoral arteries are patent without evidence of aneurysm, dissection, vasculitis or significant stenosis. Veins: The main portal, splenic, superior mesenteric vein are patent. Review of the MIP images confirms the above findings. NON-VASCULAR Lower chest: No acute abnormality. Hepatobiliary: No focal liver abnormality. No gallstones, gallbladder wall thickening, or pericholecystic fluid. No biliary dilatation. Pancreas: No focal lesion. Normal pancreatic contour. No surrounding inflammatory changes. No main pancreatic ductal dilatation. Spleen: Normal in size without focal abnormality. Adrenals/Urinary Tract: No adrenal nodule bilaterally. Bilateral kidneys enhance symmetrically. Fluid density lesions of the kidneys likely represent simple renal cysts. Simple renal cysts, in the absence of clinically indicated signs/symptoms, require no independent follow-up. No hydronephrosis. No hydroureter. The urinary bladder is unremarkable. Stomach/Bowel: Stomach is within normal limits. No evidence of bowel wall thickening or dilatation. Appendix appears normal. Lymphatic: No lymphadenopathy. Reproductive: Radiation seeds within the prostate. Otherwise prostate is unremarkable. Other: No intraperitoneal free fluid. No intraperitoneal free gas. No organized fluid collection. Musculoskeletal: No abdominal wall hernia or abnormality. No suspicious lytic or blastic osseous lesions. No acute displaced fracture. Multilevel degenerative changes of the spine. L3-L5 posterolateral and interbody surgical hardware fusion IMPRESSION: VASCULAR 1. No CT evidence of gastrointestinal hemorrhage. 2. No acute vascular  abnormality. 3. 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 4.  Aortic Atherosclerosis (ICD10-I70.0). NON-VASCULAR 1. No acute intra-abdominal or intrapelvic abnormality. Electronically Signed   By: Tish Frederickson M.D.   On: 06/02/2023 18:10    Procedures Procedures    Medications Ordered in ED Medications  iohexol (OMNIPAQUE) 350 MG/ML injection 80 mL (75 mLs Intravenous Contrast Given 06/02/23 1630)    ED Course/ Medical Decision Making/ A&P                                 Medical Decision Making Amount and/or Complexity of Data Reviewed Labs: ordered.   BP (!) 144/85 (BP Location: Left Arm)   Pulse 72   Temp 98 F (36.7 C) (Oral)   Resp 18   SpO2 95%   51:54 PM  78 year old male with PMH significant for adenomatous polyp of the colon and malignant neoplasm of the prostate presents for blood in the stool for  four days and associated abdominal pain. He reports having stomach issues for over two years which resulted in an inconclusive workup by the [place]. In the past four days he has experienced frank blood each time he uses the restroom, pain with defecation and wiping, as well as nausea/dry heaving.  He notes significantly reduced appetite secondary to stomach discomfort but has been able to keep down liquids and some soup. Denies any aggravating or alleviating factors. Denies fevers, hematuria, dysuria, incontinence, hematemesis.  Patient also report he takes Plavix for stents placed in his left lower extremity due to peripheral vascular disease.  Denies alcohol use, and does use marijuana on occasion.  Exam notable for a frail-appearing elderly male laying in bed with his eyes closed in no acute discomfort.  He does have some reproducible abdominal tenderness mostly to the epigastric region.  His bowel sounds are present.  On rectal exam patient has normal rectal tone no obvious mass normal color stool on glove no frank bleeding.   Patient does exhibit some discomfort with rectal exam.  -Labs ordered, independently viewed and interpreted by me.  Labs remarkable for mild cellulitis with AST 70, ALT 58, alk phos of 179.  CBG elevated at 239.  Fecal occult blood test is negative. -The patient was maintained on a cardiac monitor.  I personally viewed and interpreted the cardiac monitored which showed an underlying rhythm of: Normal sinus rhythm -Imaging independently viewed and interpreted by me and I agree with radiologist's interpretation.  Result remarkable for abdominal pelvis CT scan with angiogram showing no active bleeding and no acute concerning finding.  Normal gallbladder. -This patient presents to the ED for concern of rectal bleeding, this involves an extensive number of treatment options, and is a complaint that carries with it a high risk of complications and morbidity.  The differential diagnosis includes upper GI bleed, lower GI bleed, AVM, colitis, diverticulitis, malignancy, hemorrhoidal bleed, IBD, anal fissure, perirectal abscess -Co morbidities that complicate the patient evaluation includes colon polyps, prostate cancer -Treatment including pain medication offered but patient declined -Reevaluation of the patient after these medicines showed that the patient stayed the same -PCP office notes or outside notes reviewed -Discussion with attending Dr. Lockie Mola -Escalation to admission/observation considered: patients feels much better, is comfortable with discharge, and will follow up with GI -Prescription medication considered, patient comfortable with Miralax -Social Determinant of Health considered which includes tobacco use.         Final Clinical Impression(s) / ED Diagnoses Final diagnoses:  Rectal bleed    Rx / DC Orders ED Discharge Orders     None         Fayrene Helper, PA-C 06/02/23 1843    Virgina Norfolk, DO 06/02/23 2045

## 2023-06-02 NOTE — ED Triage Notes (Signed)
 Pt BIBA from home for bloody stool, abd pain, nv x3 days. Describes as "clotty"  140/80 HR 70 CBG 220 hx dm

## 2023-06-03 LAB — ABO/RH: ABO/RH(D): B POS

## 2024-01-28 ENCOUNTER — Encounter: Payer: Self-pay | Admitting: Cardiology

## 2024-01-31 ENCOUNTER — Other Ambulatory Visit (HOSPITAL_COMMUNITY): Payer: Self-pay | Admitting: Cardiology

## 2024-01-31 DIAGNOSIS — I5041 Acute combined systolic (congestive) and diastolic (congestive) heart failure: Secondary | ICD-10-CM

## 2024-02-08 ENCOUNTER — Encounter (HOSPITAL_COMMUNITY)
Admission: RE | Admit: 2024-02-08 | Discharge: 2024-02-08 | Disposition: A | Discharge status: Home / Self Care | Source: Ambulatory Visit | Attending: Cardiology | Admitting: Cardiology

## 2024-02-08 ENCOUNTER — Encounter (HOSPITAL_COMMUNITY)
Admission: RE | Admit: 2024-02-08 | Discharge: 2024-02-08 | Disposition: A | Source: Ambulatory Visit | Attending: Cardiology | Admitting: Cardiology

## 2024-02-08 DIAGNOSIS — I5041 Acute combined systolic (congestive) and diastolic (congestive) heart failure: Secondary | ICD-10-CM | POA: Insufficient documentation

## 2024-02-08 MED ORDER — TECHNETIUM TC 99M PYROPHOSPHATE
20.0000 | Freq: Once | INTRAVENOUS | Status: AC | PRN
  Administered 2024-02-08: 20 via INTRAVENOUS
  Filled 2024-02-08: qty 20
